# Patient Record
Sex: Male | Born: 1940 | Race: Black or African American | Hispanic: No | Marital: Single | Smoking: Never smoker
Health system: Southern US, Community
[De-identification: ages and names within clinical notes are randomized; demographics above are authoritative.]

## PROBLEM LIST (undated history)

## (undated) DIAGNOSIS — I10 Essential (primary) hypertension: Secondary | ICD-10-CM

## (undated) HISTORY — PX: NO PAST SURGERIES: SHX2092

---

## 2011-03-29 ENCOUNTER — Emergency Department (HOSPITAL_BASED_OUTPATIENT_CLINIC_OR_DEPARTMENT_OTHER)
Admission: EM | Admit: 2011-03-29 | Discharge: 2011-03-29 | Disposition: A | Discharge status: Home / Self Care | Payer: PRIVATE HEALTH INSURANCE | Attending: Emergency Medicine | Admitting: Emergency Medicine

## 2011-03-29 DIAGNOSIS — M722 Plantar fascial fibromatosis: Secondary | ICD-10-CM | POA: Insufficient documentation

## 2011-03-29 DIAGNOSIS — M79609 Pain in unspecified limb: Secondary | ICD-10-CM | POA: Insufficient documentation

## 2017-05-13 ENCOUNTER — Other Ambulatory Visit (HOSPITAL_BASED_OUTPATIENT_CLINIC_OR_DEPARTMENT_OTHER): Payer: Self-pay | Admitting: Unknown Physician Specialty

## 2017-05-13 DIAGNOSIS — M5489 Other dorsalgia: Secondary | ICD-10-CM

## 2017-05-17 ENCOUNTER — Encounter (HOSPITAL_BASED_OUTPATIENT_CLINIC_OR_DEPARTMENT_OTHER): Payer: Self-pay

## 2017-05-17 ENCOUNTER — Ambulatory Visit (HOSPITAL_BASED_OUTPATIENT_CLINIC_OR_DEPARTMENT_OTHER): Payer: PRIVATE HEALTH INSURANCE

## 2017-05-20 ENCOUNTER — Ambulatory Visit: Payer: PRIVATE HEALTH INSURANCE | Admitting: Physical Therapy

## 2018-05-06 ENCOUNTER — Other Ambulatory Visit: Payer: Self-pay

## 2018-05-06 ENCOUNTER — Encounter: Payer: Self-pay | Admitting: Physical Therapy

## 2018-05-06 ENCOUNTER — Ambulatory Visit: Payer: Medicare (Managed Care) | Attending: Internal Medicine | Admitting: Physical Therapy

## 2018-05-06 DIAGNOSIS — M6281 Muscle weakness (generalized): Secondary | ICD-10-CM | POA: Diagnosis present

## 2018-05-06 DIAGNOSIS — G8929 Other chronic pain: Secondary | ICD-10-CM | POA: Diagnosis present

## 2018-05-06 DIAGNOSIS — R293 Abnormal posture: Secondary | ICD-10-CM | POA: Diagnosis present

## 2018-05-06 DIAGNOSIS — R29898 Other symptoms and signs involving the musculoskeletal system: Secondary | ICD-10-CM | POA: Diagnosis present

## 2018-05-06 DIAGNOSIS — M5441 Lumbago with sciatica, right side: Secondary | ICD-10-CM | POA: Insufficient documentation
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
## 2018-05-11 ENCOUNTER — Ambulatory Visit: Payer: Medicare (Managed Care)

## 2018-05-11 DIAGNOSIS — R293 Abnormal posture: Secondary | ICD-10-CM

## 2018-05-11 DIAGNOSIS — M6281 Muscle weakness (generalized): Secondary | ICD-10-CM

## 2018-05-11 DIAGNOSIS — M5441 Lumbago with sciatica, right side: Principal | ICD-10-CM

## 2018-05-11 DIAGNOSIS — G8929 Other chronic pain: Secondary | ICD-10-CM

## 2018-05-11 DIAGNOSIS — R29898 Other symptoms and signs involving the musculoskeletal system: Secondary | ICD-10-CM

## 2018-05-11 NOTE — Therapy (Addendum)
[facility] [place] [address]  [address] [place], [place], [address] Phone: [phone]   Fax:  [phone]  Physical Therapy Treatment  Patient Details  Name: Ross Friedman MRN: [id number] Date of Birth: 03/24/1941 Referring Provider: Otila BackMolly Collins, MD   Encounter Date: 05/11/2018  PT End of Session - 05/11/18 0937    Visit Number  2    Number of Visits  13    Date for PT Re-Evaluation  06/17/18    Authorization Type  Commercial    PT Start Time  0930    PT Stop Time  1023    PT Time Calculation (min)  53 min    Activity Tolerance  Patient tolerated treatment well    Behavior During Therapy  [facility] for tasks assessed/performed       No past medical history on file.  No past surgical history on file.  There were no vitals filed for this visit.  Subjective Assessment - 05/11/18 0941    Subjective  Notes no issues with HEP.      Diagnostic tests  06/27/17 lumbar MRI: acute or subacute L1 compression fracture     Patient Stated Goals  get better    Currently in Pain?  Yes    Pain Score  6     Pain Location  Back    Pain Orientation  Right;Lower    Pain Descriptors / Indicators  Sharp    Pain Type  Chronic pain    Pain Radiating Towards  R LE     Pain Onset  More than a month ago    Pain Frequency  Constant    Aggravating Factors   unsure     Pain Relieving Factors  sitting resting     Multiple Pain Sites  No            Modalities:   E-stim: 80-150Hz , intensity to pt. Tolerance, 15', lumbar spine   Moist heat to lumbar spine: 2215'            [facility] Adult PT Treatment/Exercise - 05/11/18 0951      Lumbar Exercises: Stretches   Single Knee to Chest Stretch  Right;Left;1 rep;30 seconds    Single Knee to Chest Stretch Limitations  Cues for proper motion     Lower Trunk Rotation  20 seconds;3 reps    Lower Trunk Rotation Limitations  cues to avoid painful arc    Piriformis Stretch  Right;Left;30 seconds;2  reps    Piriformis Stretch Limitations  Figure 4, KTOS      Lumbar Exercises: Aerobic   Nustep  Lvl 5, 7 min    LE only     Lumbar Exercises: Standing   Other Standing Lumbar Exercises  B forward 6" step-up x 10 reps each; focusing on slow eccentric movements     Other Standing Lumbar Exercises  B lateral 6" step ups x 10 with cueing to brace abdominal musculature for neutral lumbar spine       Lumbar Exercises: Supine   Bent Knee Raise  10 reps;3 seconds    Bent Knee Raise Limitations  red looped TB at knees       Lumbar Exercises: Sidelying   Hip Abduction  Right;Left;10 reps;3 seconds               PT Short Term Goals - 05/11/18 0944      PT SHORT TERM GOAL #1   Title  Patient to be independent with initial  HEP.    Time  3    Period  Weeks    Status  On-going        PT Long Term Goals - 05/11/18 0940      PT LONG TERM GOAL #1   Title  Patient to be independent with advanced HEP.    Time  6    Period  Weeks    Status  On-going      PT LONG TERM GOAL #2   Title  Patient to demonstrate [facility] and pain-free lumbar AROM.    Time  6    Period  Weeks    Status  On-going      PT LONG TERM GOAL #3   Title  Patient to demonstrate >=4+/5 strength in B LEs.    Time  6    Period  Weeks    Status  On-going      PT LONG TERM GOAL #4   Title  Patient to report tolerance of 1 hour of standing without pain limiting.    Time  6    Period  Weeks    Status  On-going      PT LONG TERM GOAL #5   Title  Patient to report tolerance of climbing 3 flights of stairs to apartment without taking a break d/t pain.    Time  6    Period  Weeks    Status  On-going            Plan - 05/11/18 0944    Clinical Impression Statement  Pt. doing well today and notes he did not have issues with HEP.  Did require minor correction of technique with HEP review today to avoid painful end range motion and overstretching.  Tolerated addition of forward and lateral step-up well today.   Did end therex with lower back pain thus initiated trial of E-stim/moist heat to lumbar spine to decrease pain and tone.  Ended session pain free.  Will continue to progress toward goals.      PT Treatment/Interventions  ADLs/Self Care Home Management;Cryotherapy;Electrical Stimulation;Moist Heat;Ultrasound;Gait training;Stair training;Functional mobility training;Therapeutic activities;Therapeutic exercise;Manual techniques;Patient/family education;Neuromuscular re-education;Balance training;Passive range of motion;Dry needling;Energy conservation;Splinting;Taping    Consulted and Agree with Plan of Care  Patient       Patient will benefit from skilled therapeutic intervention in order to improve the following deficits and impairments:  Decreased activity tolerance, Decreased strength, Pain, Difficulty walking, Decreased range of motion, Postural dysfunction, Impaired flexibility  Visit Diagnosis: Chronic midline low back pain with right-sided sciatica  Other symptoms and signs involving the musculoskeletal system  Muscle weakness (generalized)  Abnormal posture     Problem List There are no active problems to display for this patient.   Kermit BaloMicah Gena Laski, PTA 05/11/18 3:06 PM    [facility] [place] [address]  [address] [place], [place], [address] Phone: [phone]   Fax:  [phone]  Name: Ross Friedman MRN: [id number] Date of Birth: 03/14/1941

## 2018-05-13 ENCOUNTER — Ambulatory Visit: Payer: Medicare (Managed Care)

## 2018-05-13 DIAGNOSIS — G8929 Other chronic pain: Secondary | ICD-10-CM

## 2018-05-13 DIAGNOSIS — M5441 Lumbago with sciatica, right side: Principal | ICD-10-CM

## 2018-05-13 DIAGNOSIS — R293 Abnormal posture: Secondary | ICD-10-CM

## 2018-05-13 DIAGNOSIS — R29898 Other symptoms and signs involving the musculoskeletal system: Secondary | ICD-10-CM

## 2018-05-13 DIAGNOSIS — M6281 Muscle weakness (generalized): Secondary | ICD-10-CM

## 2018-05-13 NOTE — Therapy (Signed)
[facility] [address]  [address] [place], [place], [address] Phone: [phone]   Fax:  [phone]  Physical Therapy Treatment  Patient Details  Name: Ross DadLesca Friedman MRN: [id number] Date of Birth: 02/23/1941 Referring Provider: Otila BackMolly Collins, MD   Encounter Date: 05/13/2018  PT End of Session - 05/13/18 1443    Visit Number  3    Number of Visits  13    Date for PT Re-Evaluation  06/17/18    Authorization Type  Commercial    PT Start Time  1440    PT Stop Time  1540    PT Time Calculation (min)  60 min    Activity Tolerance  Patient tolerated treatment well    Behavior During Therapy  [facility] for tasks assessed/performed       No past medical history on file.  No past surgical history on file.  There were no vitals filed for this visit.  Subjective Assessment - 05/13/18 1441    Subjective  Pt. doing well today noting no soreness following last visit.      Diagnostic tests  06/27/17 lumbar MRI: acute or subacute L1 compression fracture     Patient Stated Goals  get better    Currently in Pain?  Yes    Pain Score  7     Pain Location  Back    Pain Orientation  Right;Lower    Pain Descriptors / Indicators  Sharp    Pain Type  Chronic pain    Pain Radiating Towards  radiating "tingling" pain down R LE    Pain Onset  More than a month ago    Pain Frequency  Constant    Aggravating Factors   walking     Multiple Pain Sites  No                       [facility] Adult PT Treatment/Exercise - 05/13/18 1454      Lumbar Exercises: Aerobic   Nustep  Lvl 5, 5 min    UE/LE     Lumbar Exercises: Machines for Strengthening   Cybex Knee Flexion  B LE's: 20# x 15 rpes       Lumbar Exercises: Standing   Row  --    Theraband Level (Row)  --    Other Standing Lumbar Exercises  --      Lumbar Exercises: Seated   Other Seated Lumbar Exercises  B seated HS curl with red TB x 10 reps each       Lumbar Exercises:  Supine   Ab Set  15 reps;5 seconds    AB Set Limitations  + TrA    Clam  10 reps;3 seconds    Clam Limitations  Hooklying with red TB at knees    Bridge  10 reps;3 seconds   limited ROM    Bridge Limitations  with isometric hip abd/ER into red TB at knees     Straight Leg Raise  10 reps;3 seconds    Straight Leg Raises Limitations  L only     Isometric Hip Flexion  15 reps;5 seconds    Isometric Hip Flexion Limitations  with heels on peanut p-ball       Knee/Hip Exercises: Standing   Forward Step Up  Right;Left;Step Height: 8";10 reps    Forward Step Up Limitations  + abdom. bracing with 1 ski pole support      Modalities   Modalities  Electrical Stimulation;Moist Heat      Moist Heat Therapy   Number Minutes Moist Heat  15 Minutes    Moist Heat Location  Lumbar Spine      Electrical Stimulation   Electrical Stimulation Location  lumbar spine     Electrical Stimulation Action  IFC    Electrical Stimulation Parameters  to tolerance, 15'    Electrical Stimulation Goals  Pain               PT Short Term Goals - 05/11/18 0944      PT SHORT TERM GOAL #1   Title  Patient to be independent with initial HEP.    Time  3    Period  Weeks    Status  On-going        PT Long Term Goals - 05/11/18 0940      PT LONG TERM GOAL #1   Title  Patient to be independent with advanced HEP.    Time  6    Period  Weeks    Status  On-going      PT LONG TERM GOAL #2   Title  Patient to demonstrate [facility] and pain-free lumbar AROM.    Time  6    Period  Weeks    Status  On-going      PT LONG TERM GOAL #3   Title  Patient to demonstrate >=4+/5 strength in B LEs.    Time  6    Period  Weeks    Status  On-going      PT LONG TERM GOAL #4   Title  Patient to report tolerance of 1 hour of standing without pain limiting.    Time  6    Period  Weeks    Status  On-going      PT LONG TERM GOAL #5   Title  Patient to report tolerance of climbing 3 flights of stairs to apartment  without taking a break d/t pain.    Time  6    Period  Weeks    Status  On-going            Plan - 05/13/18 1444    Clinical Impression Statement  Pt. doing well today however notes continued LBP.  Pt. continues to demo flexion bias with therex with greatest comfort with hooklying activities.  Tolerated addition of sit<>stand, partial ROM bridging, and HS curl well today.  Ended session with pt. requesting E-stim/moist heat to lumbar spine as pt. noting good pain relief from this last session.      PT Treatment/Interventions  ADLs/Self Care Home Management;Cryotherapy;Electrical Stimulation;Moist Heat;Ultrasound;Gait training;Stair training;Functional mobility training;Therapeutic activities;Therapeutic exercise;Manual techniques;Patient/family education;Neuromuscular re-education;Balance training;Passive range of motion;Dry needling;Energy conservation;Splinting;Taping    Consulted and Agree with Plan of Care  Patient       Patient will benefit from skilled therapeutic intervention in order to improve the following deficits and impairments:  Decreased activity tolerance, Decreased strength, Pain, Difficulty walking, Decreased range of motion, Postural dysfunction, Impaired flexibility  Visit Diagnosis: Chronic midline low back pain with right-sided sciatica  Other symptoms and signs involving the musculoskeletal system  Muscle weakness (generalized)  Abnormal posture     Problem List There are no active problems to display for this patient.   Kermit BaloMicah Ashleah Valtierra, PTA 05/13/18 6:22 PM   [facility] [place] [address]  [address] [place], [place], [address] Phone: [phone]   Fax:  [phone]  Name: Ross DadLesca Friedman  MRN: [id number] Date of Birth: [birth year]

## 2018-05-19 ENCOUNTER — Ambulatory Visit: Payer: Medicare (Managed Care) | Admitting: Physical Therapy

## 2018-05-19 DIAGNOSIS — G8929 Other chronic pain: Secondary | ICD-10-CM

## 2018-05-19 DIAGNOSIS — M6281 Muscle weakness (generalized): Secondary | ICD-10-CM

## 2018-05-19 DIAGNOSIS — R29898 Other symptoms and signs involving the musculoskeletal system: Secondary | ICD-10-CM

## 2018-05-19 DIAGNOSIS — R293 Abnormal posture: Secondary | ICD-10-CM

## 2018-05-19 DIAGNOSIS — M5441 Lumbago with sciatica, right side: Secondary | ICD-10-CM | POA: Diagnosis not present

## 2018-05-19 NOTE — Therapy (Signed)
[facility] [address]  [address] [place], [place], [address] Phone: [phone]   Fax:  [phone]  Physical Therapy Treatment  Patient Details  Name: Ross Friedman MRN: [id number] Date of Birth: 08/24/1941 Referring Provider: Otila BackMolly Collins, MD   Encounter Date: 05/19/2018  PT End of Session - 05/19/18 1505    Visit Number  4    Number of Visits  13    Date for PT Re-Evaluation  06/17/18    Authorization Type  Commercial    PT Start Time  1315    PT Stop Time  1404    PT Time Calculation (min)  49 min    Activity Tolerance  Patient tolerated treatment well;Patient limited by pain    Behavior During Therapy  [facility] for tasks assessed/performed       No past medical history on file.  No past surgical history on file.  There were no vitals filed for this visit.  Subjective Assessment - 05/19/18 1315    Subjective  Reports he still has a little pain. After last session and over the weekend he was having a burning sensation in the R hip. Does not believe it was from last session as his symptoms are inconsistent.     Diagnostic tests  06/27/17 lumbar MRI: acute or subacute L1 compression fracture     Patient Stated Goals  get better    Currently in Pain?  Yes    Pain Score  6     Pain Location  Back    Pain Orientation  Right;Lower    Pain Descriptors / Indicators  Sharp    Pain Type  Chronic pain                       [facility] Adult PT Treatment/Exercise - 05/19/18 0001      Exercises   Exercises  Lumbar      Lumbar Exercises: Aerobic   Nustep  L3 x 6min      Lumbar Exercises: Standing   Other Standing Lumbar Exercises  pallof press with red TB x 10 each side   cues for upright stance and avoiding rotation   Other Standing Lumbar Exercises  B anterior 8" step ups x 10 with cueing to brace abdominal musculature for neutral lumbar spine       Lumbar Exercises: Supine   Ab Set  10 reps;Limitations     AB Set Limitations  TrA contraction 10x5"    Clam  15 reps;Limitations    Clam Limitations  green TB around knees; cues to increase range    Bent Knee Raise  10 reps;Limitations    Bent Knee Raise Limitations  TrA + alt march- patient unable to tolerate and discontinued    Other Supine Lumbar Exercises  bent knee fallouts + TrA x20    Other Supine Lumbar Exercises  resisted hip flexion with red TB around ankles and LEs on pball; 10x      Electrical Stimulation   Electrical Stimulation Location  R lower lumbar region    Electrical Stimulation Action  IFC    Electrical Stimulation Parameters  Output 15 to tolerance; 15"    Electrical Stimulation Goals  Pain             PT Education - 05/19/18 1505    Education Details  update to HEP    Person(s) Educated  Patient    Methods  Explanation;Demonstration;Tactile cues;Verbal cues;Handout  Comprehension  Returned demonstration;Verbalized understanding       PT Short Term Goals - 05/19/18 1508      PT SHORT TERM GOAL #1   Title  Patient to be independent with initial HEP.    Time  3    Period  Weeks    Status  Achieved        PT Long Term Goals - 05/11/18 0940      PT LONG TERM GOAL #1   Title  Patient to be independent with advanced HEP.    Time  6    Period  Weeks    Status  On-going      PT LONG TERM GOAL #2   Title  Patient to demonstrate [facility] and pain-free lumbar AROM.    Time  6    Period  Weeks    Status  On-going      PT LONG TERM GOAL #3   Title  Patient to demonstrate >=4+/5 strength in B LEs.    Time  6    Period  Weeks    Status  On-going      PT LONG TERM GOAL #4   Title  Patient to report tolerance of 1 hour of standing without pain limiting.    Time  6    Period  Weeks    Status  On-going      PT LONG TERM GOAL #5   Title  Patient to report tolerance of climbing 3 flights of stairs to apartment without taking a break d/t pain.    Time  6    Period  Weeks    Status  On-going             Plan - 05/19/18 1506    Clinical Impression Statement  Patient arrived to session with report of burning R hip pain over the weekend, improved today. Focused beginning of session on core stability exercises with manual instruction on achieving TrA contraction. Patient could not tolerate TrA contraction with marching, better tolerance of bent knee fallouts. Able to tolerate progression of supine hip ER with green banded resistance this date. Cues required to avoid hesitation and encourage increased ROM with hip strengthening ther-ex this date. Patient with good form during anterior step ups and denial of increased LBP with this activity. Patient requesting e-stim at end of session as this provides him with pain relief. Normal integumentary response noted at end of session. Updated HEP and provided patient with handout. Patient reported understanding.     PT Treatment/Interventions  ADLs/Self Care Home Management;Cryotherapy;Electrical Stimulation;Moist Heat;Ultrasound;Gait training;Stair training;Functional mobility training;Therapeutic activities;Therapeutic exercise;Manual techniques;Patient/family education;Neuromuscular re-education;Balance training;Passive range of motion;Dry needling;Energy conservation;Splinting;Taping    Consulted and Agree with Plan of Care  Patient       Patient will benefit from skilled therapeutic intervention in order to improve the following deficits and impairments:  Decreased activity tolerance, Decreased strength, Pain, Difficulty walking, Decreased range of motion, Postural dysfunction, Impaired flexibility  Visit Diagnosis: Chronic midline low back pain with right-sided sciatica  Other symptoms and signs involving the musculoskeletal system  Muscle weakness (generalized)  Abnormal posture     Problem List There are no active problems to display for this patient.   Anette GuarneriYevgeniya Shamiyah Ngu, PT, DPT 05/19/18 3:09 PM    [facility] [place] [address]  [address] [place], [place], [address] Phone: [phone]   Fax:  [phone]  Name: Ross Friedman MRN: [id number] Date of Birth: 08/19/1941

## 2018-05-21 ENCOUNTER — Ambulatory Visit: Payer: Medicare (Managed Care) | Admitting: Physical Therapy

## 2018-05-21 DIAGNOSIS — G8929 Other chronic pain: Secondary | ICD-10-CM

## 2018-05-21 DIAGNOSIS — M5441 Lumbago with sciatica, right side: Secondary | ICD-10-CM | POA: Diagnosis not present

## 2018-05-21 DIAGNOSIS — R293 Abnormal posture: Secondary | ICD-10-CM

## 2018-05-21 DIAGNOSIS — M6281 Muscle weakness (generalized): Secondary | ICD-10-CM

## 2018-05-21 DIAGNOSIS — R29898 Other symptoms and signs involving the musculoskeletal system: Secondary | ICD-10-CM

## 2018-05-21 NOTE — Therapy (Signed)
[facility] [address]  [address] [place], [place], [address] Phone: [phone]   Fax:  [phone]  Physical Therapy Treatment  Patient Details  Name: Ross Friedman MRN: [id number] Date of Birth: 02/08/1941 Referring Provider: Otila BackMolly Collins, MD   Encounter Date: 05/21/2018  PT End of Session - 05/21/18 1007    Visit Number  5    Number of Visits  13    Date for PT Re-Evaluation  06/17/18    Authorization Type  Commercial    PT Start Time  0930    PT Stop Time  1019    PT Time Calculation (min)  49 min    Activity Tolerance  Patient tolerated treatment well;Patient limited by pain    Behavior During Therapy  [facility] for tasks assessed/performed       No past medical history on file.  No past surgical history on file.  There were no vitals filed for this visit.  Subjective Assessment - 05/21/18 0931    Subjective  Reports a little better compared to yesterday but still in pain.     Diagnostic tests  06/27/17 lumbar MRI: acute or subacute L1 compression fracture     Patient Stated Goals  get better    Currently in Pain?  Yes    Pain Score  6     Pain Location  Back    Pain Orientation  Right;Lower    Pain Descriptors / Indicators  Sharp    Pain Type  Chronic pain                       [facility] Adult PT Treatment/Exercise - 05/21/18 0001      Exercises   Exercises  Knee/Hip;Lumbar      Lumbar Exercises: Aerobic   Nustep  L3 x 6min      Lumbar Exercises: Standing   Row  Strengthening;Both;15 reps;Theraband;Limitations    Theraband Level (Row)  Level 3 (Green)    Row Limitations  cues for core contraction and scap retraction    Other Standing Lumbar Exercises  pallof press with red TB x 10 each side   cues for form     Lumbar Exercises: Seated   Long Arc Quad on [place]  Strengthening;Both;1 set;20 reps   c/o back pain after completing activity   LAQ on Ball Limitations  CGA/min A; moderate  instability, worse on R     Hip Flexion on Ball  Strengthening;Right;Left;20 reps;Limitations    Hip Flexion on Ball Limitations  heavy VC/TCs for ant pelvic tilt and upright posture      Lumbar Exercises: Supine   Clam  15 reps;Limitations    Clam Limitations  green TB around knees; cues for core contraction'      Knee/Hip Exercises: Standing   Other Standing Knee Exercises  B heel toe raise at counter top x 15      Knee/Hip Exercises: Sidelying   Clams  10x each side   TCs for form     Electrical Stimulation   Electrical Stimulation Location  R lower lumbar region    Electrical Stimulation Action  IFC    Electrical Stimulation Parameters  Output: 21 to tolerance; 15 min    Electrical Stimulation Goals  Pain             PT Education - 05/21/18 1007    Education Details  edu on personal TENS use    Person(s) Educated  Patient  Methods  Explanation;Demonstration;Tactile cues;Verbal cues;Handout    Comprehension  Returned demonstration;Verbalized understanding       PT Short Term Goals - 05/19/18 1508      PT SHORT TERM GOAL #1   Title  Patient to be independent with initial HEP.    Time  3    Period  Weeks    Status  Achieved        PT Long Term Goals - 05/11/18 0940      PT LONG TERM GOAL #1   Title  Patient to be independent with advanced HEP.    Time  6    Period  Weeks    Status  On-going      PT LONG TERM GOAL #2   Title  Patient to demonstrate [facility] and pain-free lumbar AROM.    Time  6    Period  Weeks    Status  On-going      PT LONG TERM GOAL #3   Title  Patient to demonstrate >=4+/5 strength in B LEs.    Time  6    Period  Weeks    Status  On-going      PT LONG TERM GOAL #4   Title  Patient to report tolerance of 1 hour of standing without pain limiting.    Time  6    Period  Weeks    Status  On-going      PT LONG TERM GOAL #5   Title  Patient to report tolerance of climbing 3 flights of stairs to apartment without taking a break d/t  pain.    Time  6    Period  Weeks    Status  On-going            Plan - 05/21/18 1008    Clinical Impression Statement  Patient arrived to session with no new complaints. Reports e-stim is giving him benefit- spoke to patient about personal TENS unit use and provided education. Patient reported understanding. Introduced Engineer, agricultural for progressive hip strengthening- good tolerance by patient. Worked on postural correction exercises with cues required for core contraction and scapular retraction. Introduced core stability exercises sitting on pball- patient with moderate instability and report of LBP after completing LAQ, better tolerance of sitting marching. Requested e-stim to LB at end of session for pain relief. Normal integumentary response noted at end of session.     PT Treatment/Interventions  ADLs/Self Care Home Management;Cryotherapy;Electrical Stimulation;Moist Heat;Ultrasound;Gait training;Stair training;Functional mobility training;Therapeutic activities;Therapeutic exercise;Manual techniques;Patient/family education;Neuromuscular re-education;Balance training;Passive range of motion;Dry needling;Energy conservation;Splinting;Taping    Consulted and Agree with Plan of Care  Patient       Patient will benefit from skilled therapeutic intervention in order to improve the following deficits and impairments:  Decreased activity tolerance, Decreased strength, Pain, Difficulty walking, Decreased range of motion, Postural dysfunction, Impaired flexibility  Visit Diagnosis: Chronic midline low back pain with right-sided sciatica  Other symptoms and signs involving the musculoskeletal system  Muscle weakness (generalized)  Abnormal posture     Problem List There are no active problems to display for this patient.   Anette Guarneri, PT, DPT 05/21/18 11:31 AM   [facility] [address]  [address] [place], [place],  [address] Phone: [phone]   Fax:  [phone]  Name: Seger Jani MRN: [id number] Date of Birth: [birth year]

## 2018-05-21 NOTE — Patient Instructions (Signed)

## 2018-05-26 ENCOUNTER — Encounter: Payer: Self-pay | Admitting: Physical Therapy

## 2018-05-26 ENCOUNTER — Ambulatory Visit: Payer: Medicare (Managed Care) | Admitting: Physical Therapy

## 2018-05-26 DIAGNOSIS — M5441 Lumbago with sciatica, right side: Secondary | ICD-10-CM | POA: Diagnosis not present

## 2018-05-26 DIAGNOSIS — R29898 Other symptoms and signs involving the musculoskeletal system: Secondary | ICD-10-CM

## 2018-05-26 DIAGNOSIS — G8929 Other chronic pain: Secondary | ICD-10-CM

## 2018-05-26 DIAGNOSIS — R293 Abnormal posture: Secondary | ICD-10-CM

## 2018-05-26 DIAGNOSIS — M6281 Muscle weakness (generalized): Secondary | ICD-10-CM

## 2018-05-26 NOTE — Therapy (Signed)
[facility] [address]  [address] [place], [place], [address] Phone: [phone]   Fax:  [phone]  Physical Therapy Treatment  Patient Details  Name: Ross DadLesca Friedman MRN: [id number] Date of Birth: 06/03/1941 Referring Provider: Otila BackMolly Collins, MD   Encounter Date: 05/26/2018  PT End of Session - 05/26/18 1410    Visit Number  6    Number of Visits  13    Date for PT Re-Evaluation  06/17/18    Authorization Type  Commercial    PT Start Time  1315    PT Stop Time  1405    PT Time Calculation (min)  50 min    Activity Tolerance  Patient tolerated treatment well;Patient limited by pain    Behavior During Therapy  [facility] for tasks assessed/performed       History reviewed. No pertinent past medical history.  History reviewed. No pertinent surgical history.  There were no vitals filed for this visit.  Subjective Assessment - 05/26/18 1316    Subjective  Patient reports he is still having a considerabnle about of pain. Over the weekend, have intense pain and only mildly alleviated with 1000mg  of tylenol. Patient reports he feels like he is getting strong but has not noticed much improvement in pain levels. TENS unit is the only thing that seems to help.     Diagnostic tests  06/27/17 lumbar MRI: acute or subacute L1 compression fracture     Patient Stated Goals  get better    Currently in Pain?  Yes    Pain Score  5     Pain Location  Back    Pain Orientation  Right;Lower    Pain Descriptors / Indicators  Sharp    Pain Type  Chronic pain                       [facility] Adult PT Treatment/Exercise - 05/26/18 0001      Exercises   Exercises  Lumbar;Knee/Hip      Lumbar Exercises: Seated   Long Arc Quad on [place]  Strengthening;1 set;Right;Left;10 reps;Limitations    LAQ on [place] Limitations  sitting on green pball palloff press with red TB 10x each UE   heavy VCs for ant pelvic tilt and scap retraction   Hip  Flexion on Ball  Strengthening;Right;Left;Limitations;10 reps    Hip Flexion on Ball Limitations  discontinued d/t R back pain    Other Seated Lumbar Exercises  scapular row sitting on greeen pball x 10 red TB   cues for scap squeeze and upright posture   Other Seated Lumbar Exercises  sitting on pball pelvic tilts 10x   pain with anterior pelvic tilt     Knee/Hip Exercises: Machines for Strengthening   Cybex Knee Extension  B LE 15# x 10    Cybex Knee Flexion  B LE 20# x 10             PT Education - 05/26/18 1401    Education Details  considerable amount of time spent on instruction of assembly, precautions, electrode placement, and practice of personal TENS unit use.    Person(s) Educated  Patient    Methods  Explanation;Demonstration;Tactile cues;Verbal cues;Handout    Comprehension  Returned demonstration;Verbalized understanding       PT Short Term Goals - 05/19/18 1508      PT SHORT TERM GOAL #1   Title  Patient to be independent with initial HEP.  Time  3    Period  Weeks    Status  Achieved        PT Long Term Goals - 05/11/18 0940      PT LONG TERM GOAL #1   Title  Patient to be independent with advanced HEP.    Time  6    Period  Weeks    Status  On-going      PT LONG TERM GOAL #2   Title  Patient to demonstrate [facility] and pain-free lumbar AROM.    Time  6    Period  Weeks    Status  On-going      PT LONG TERM GOAL #3   Title  Patient to demonstrate >=4+/5 strength in B LEs.    Time  6    Period  Weeks    Status  On-going      PT LONG TERM GOAL #4   Title  Patient to report tolerance of 1 hour of standing without pain limiting.    Time  6    Period  Weeks    Status  On-going      PT LONG TERM GOAL #5   Title  Patient to report tolerance of climbing 3 flights of stairs to apartment without taking a break d/t pain.    Time  6    Period  Weeks    Status  On-going            Plan - 05/26/18 1410    Clinical Impression Statement   Patient arrived to session with report of increased R sided LBP starting on Friday and through the weekend, will improvement in pain levels today without known cause. Considerable amount of time spent on instruction of precautions, electrode placement, and practice of personal TENS unit use. All questions answered by the end of session. Worked on core and LE strengthening this session however patient with limited tolerance of LE marching while sitting on physioball. Activity was discontinued. Patient reporting that he feels stronger since starting PT however has not noticed much improvement in pain levels. Patient also with no consistent aggravating/easing factors and for this reason gave patient recommendation of following up with his MD about persisting back pain. Patient reported understanding.     PT Treatment/Interventions  ADLs/Self Care Home Management;Cryotherapy;Electrical Stimulation;Moist Heat;Ultrasound;Gait training;Stair training;Functional mobility training;Therapeutic activities;Therapeutic exercise;Manual techniques;Patient/family education;Neuromuscular re-education;Balance training;Passive range of motion;Dry needling;Energy conservation;Splinting;Taping    Consulted and Agree with Plan of Care  Patient       Patient will benefit from skilled therapeutic intervention in order to improve the following deficits and impairments:  Decreased activity tolerance, Decreased strength, Pain, Difficulty walking, Decreased range of motion, Postural dysfunction, Impaired flexibility  Visit Diagnosis: Chronic midline low back pain with right-sided sciatica  Other symptoms and signs involving the musculoskeletal system  Muscle weakness (generalized)  Abnormal posture     Problem List There are no active problems to display for this patient.   Anette Guarneri, PT, DPT 05/26/18 2:13 PM   [facility] [address]  [address] [place], [place], [address] Phone: [phone]   Fax:  [phone]  Name: Ross Friedman MRN: [id number] Date of Birth: 1941/01/27

## 2018-05-26 NOTE — Patient Instructions (Signed)
TENS stands for Transcutaneous Electrical Nerve Stimulation. In other words, electrical impulses are allowed to pass through the skin in order to excite a nerve.   Purpose and Use of TENS:  TENS is a method used to manage acute and chronic pain without the use of drugs. It has been effective in managing pain associated with surgery, sprains, strains, trauma, rheumatoid arthritis, and neuralgias. It is a non-addictive, low risk, and non-invasive technique used to control pain. It is not, by any means, a curative form of treatment.   How TENS Works:  Most TENS units are a small pocket-sized unit powered by one 9 volt battery. Attached to the outside of the unit are two lead wires where two pins and/or snaps connect on each wire. All units come with a set of four reusable pads or electrodes. These are placed on the skin surrounding the area involved. By inserting the leads into  the pads, the electricity can pass from the unit making the circuit complete.  As the intensity is turned up slowly, the electrical current enters the body from the electrodes through the skin to the surrounding nerve fibers. This triggers the release of hormones from within the body. These hormones contain pain relievers. By increasing the circulation of these hormones, the person's pain may be lessened. It is also believed that the electrical stimulation itself helps to block the pain messages being sent to the brain, thus also decreasing the body's perception of pain.   Hazards:  TENS units are NOT to be used by patients with PACEMAKERS, DEFIBRILLATORS, DIABETIC PUMPS, PREGNANT WOMEN, and patients with SEIZURE DISORDERS.  TENS units are NOT to be used over the heart, throat, brain, or spinal cord.  One of the major side effects from the TENS unit may be skin irritation. Some people may develop a rash if they are sensitive to the materials used in the electrodes or the connecting wires.   Wear the unit for 15 minutes.   Avoid  overuse due the body getting used to the stem making it not as effective over time.    

## 2018-05-28 ENCOUNTER — Ambulatory Visit: Payer: Medicare (Managed Care)

## 2018-05-28 DIAGNOSIS — G8929 Other chronic pain: Secondary | ICD-10-CM

## 2018-05-28 DIAGNOSIS — M5441 Lumbago with sciatica, right side: Principal | ICD-10-CM

## 2018-05-28 DIAGNOSIS — R293 Abnormal posture: Secondary | ICD-10-CM

## 2018-05-28 DIAGNOSIS — M6281 Muscle weakness (generalized): Secondary | ICD-10-CM

## 2018-05-28 DIAGNOSIS — R29898 Other symptoms and signs involving the musculoskeletal system: Secondary | ICD-10-CM

## 2018-05-28 NOTE — Therapy (Signed)
[facility] Outpatient Rehabilitation [facility] [address]  [address] [place], [place], [address] Phone: [phone]   Fax:  [phone]  Physical Therapy Treatment  Patient Details  Name: Ross Friedman MRN: [id number] Date of Birth: 1941-02-15 Referring Provider: Otila Back, MD   Encounter Date: 05/28/2018  PT End of Session - 05/28/18 1407    Visit Number  7    Number of Visits  13    Date for PT Re-Evaluation  06/17/18    Authorization Type  Commercial    PT Start Time  1403    PT Stop Time  1500    PT Time Calculation (min)  57 min    Activity Tolerance  Patient tolerated treatment well;Patient limited by pain    Behavior During Therapy  [facility] for tasks assessed/performed       No past medical history on file.  No past surgical history on file.  There were no vitals filed for this visit.  Subjective Assessment - 05/28/18 1405    Subjective  Pt. noting some mild improvement in pain since starting therapy however notes climbing stairs is, "much easier" now.      Diagnostic tests  06/27/17 lumbar MRI: acute or subacute L1 compression fracture     Patient Stated Goals  get better    Currently in Pain?  Yes    Pain Score  7     Pain Location  Back    Pain Orientation  Right;Lower    Pain Descriptors / Indicators  Sharp    Pain Type  Chronic pain    Pain Radiating Towards  None today    Pain Onset  More than a month ago    Pain Frequency  Constant    Aggravating Factors   Sitting on low surface,       Pain Relieving Factors  sitting, resting    Multiple Pain Sites  No                       [facility] Adult PT Treatment/Exercise - 05/28/18 1433      Lumbar Exercises: Aerobic   Nustep  L4 x      Lumbar Exercises: Seated   Sit to Stand  10 reps    Sit to Stand Limitations  from mat table without UE support       Lumbar Exercises: Supine   Glut Set  10 reps;5 seconds    Glut Set Limitations  + abdom brace     Clam  15  reps;Limitations    Clam Limitations  red TB at knees with alternating hip abd/ER clam shell     Bent Knee Raise  3 seconds   x 12 reps    Bent Knee Raise Limitations  Alternating march with red looped TB at knees       Moist Heat Therapy   Number Minutes Moist Heat  15 Minutes    Moist Heat Location  Lumbar Spine      Electrical Stimulation   Electrical Stimulation Location  R lower lumbar region    Electrical Stimulation Action  IFC    Electrical Stimulation Parameters  to tolerance, 15'    Electrical Stimulation Goals  Pain      Manual Therapy   Manual Therapy  Soft tissue mobilization    Manual therapy comments  sidelying with bolster     Soft tissue mobilization  STM to L buttocks and L paraspinals with some tenderness  PT Short Term Goals - 05/19/18 1508      PT SHORT TERM GOAL #1   Title  Patient to be independent with initial HEP.    Time  3    Period  Weeks    Status  Achieved        PT Long Term Goals - 05/11/18 0940      PT LONG TERM GOAL #1   Title  Patient to be independent with advanced HEP.    Time  6    Period  Weeks    Status  On-going      PT LONG TERM GOAL #2   Title  [facility] and pain-free lumbar AROM.    Time  6    Period  Weeks    Status  On-going      PT LONG TERM GOAL #3   Title  Patient to demonstrate >=4+/5 strength in B LEs.    Time  6    Period  Weeks    Status  On-going      PT LONG TERM GOAL #4   Title  Patient to report tolerance of 1 hour of standing without pain limiting.    Time  6    Period  Weeks    Status  On-going      PT LONG TERM GOAL #5   Title  Patient to report tolerance of climbing 3 flights of stairs to apartment without taking a break d/t pain.    Time  6    Period  Weeks    Status  On-going            Plan - 05/28/18 1431    Clinical Impression Statement  Keaden noting improvement in LE strength and greater ease navigating stairs at home.  Tolerated  progression of lumbopelvic strengthening supine with flexion bias in hooklying well today.  Ended visit with E-stim/moist heat to lumbar spine to decrease post-exercise pain.  Progressing toward goals.      PT Treatment/Interventions  ADLs/Self Care Home Management;Cryotherapy;Electrical Stimulation;Moist Heat;Ultrasound;Gait training;Stair training;Functional mobility training;Therapeutic activities;Therapeutic exercise;Manual techniques;Patient/family education;Neuromuscular re-education;Balance training;Passive range of motion;Dry needling;Energy conservation;Splinting;Taping    Consulted and Agree with Plan of Care  Patient       Patient will benefit from skilled therapeutic intervention in order to improve the following deficits and impairments:  Decreased activity tolerance, Decreased strength, Pain, Difficulty walking, Decreased range of motion, Postural dysfunction, Impaired flexibility  Visit Diagnosis: Chronic midline low back pain with right-sided sciatica  Other symptoms and signs involving the musculoskeletal system  Muscle weakness (generalized)  Abnormal posture     Problem List There are no active problems to display for this patient.   Kermit BaloMicah Jannine Abreu, PTA 05/28/18 6:55 PM   [facility] [place] [address]  [address] [place], [place], [address] Phone: [phone]   Fax:  [phone]  Name: Ross Friedman MRN: [id number] Date of Birth: 02/18/1941

## 2018-06-02 ENCOUNTER — Ambulatory Visit: Payer: PRIVATE HEALTH INSURANCE | Attending: Internal Medicine

## 2018-06-02 DIAGNOSIS — R293 Abnormal posture: Secondary | ICD-10-CM

## 2018-06-02 DIAGNOSIS — M6281 Muscle weakness (generalized): Secondary | ICD-10-CM

## 2018-06-02 DIAGNOSIS — G8929 Other chronic pain: Secondary | ICD-10-CM | POA: Diagnosis present

## 2018-06-02 DIAGNOSIS — M5441 Lumbago with sciatica, right side: Secondary | ICD-10-CM | POA: Insufficient documentation

## 2018-06-02 DIAGNOSIS — R29898 Other symptoms and signs involving the musculoskeletal system: Secondary | ICD-10-CM

## 2018-06-02 NOTE — Therapy (Addendum)
[facility] [address]  [address] [place], [place], [address] Phone: [phone]   Fax:  [phone]  Physical Therapy Treatment  Patient Details  Name: Ross Friedman MRN: [id number] Date of Birth: [birth year] Referring Provider: Otila Back, MD   Encounter Date: 06/02/2018  PT End of Session - 06/02/18 1409    Visit Number  8    Number of Visits  13    Date for PT Re-Evaluation  06/17/18    Authorization Type  Commercial    PT Start Time  1402    PT Stop Time  1455    PT Time Calculation (min)  53 min    Activity Tolerance  Patient tolerated treatment well    Behavior During Therapy  [facility] for tasks assessed/performed       No past medical history on file.  No past surgical history on file.  There were no vitals filed for this visit.  Subjective Assessment - 06/02/18 1407    Subjective  Pt. doing well today.      Diagnostic tests  06/27/17 lumbar MRI: acute or subacute L1 compression fracture     Patient Stated Goals  get better    Currently in Pain?  Yes    Pain Score  8     Pain Location  Back    Pain Orientation  Right;Lower    Pain Descriptors / Indicators  Sharp    Pain Type  Chronic pain    Pain Onset  More than a month ago    Pain Frequency  Constant    Multiple Pain Sites  No                       [facility] Adult PT Treatment/Exercise - 06/02/18 1416      Lumbar Exercises: Aerobic   Nustep  L4 x      Lumbar Exercises: Machines for Strengthening   Cybex Knee Flexion  B LE's: 25# x 10 rpes     Other Lumbar Machine Exercise  BATCA low row 15# x 15 reps     Other Lumbar Machine Exercise  BATCA lat pulldown 20# x 10 rpes       Lumbar Exercises: Standing   Row  Strengthening;Both;15 reps;Theraband;Limitations    Theraband Level (Row)  Level 3 (Green)    Shoulder Extension  10 reps;Strengthening;Theraband;Both    Theraband Level (Shoulder Extension)  Level 2 (Red)    Shoulder  Extension Limitations  Cues for scapular retraction       Lumbar Exercises: Seated   Other Seated Lumbar Exercises  Seated B pallof press with red TB seated on aqua disk on table x 10 rpes each way       Lumbar Exercises: Supine   Bridge  3 seconds   x 12 reps      Moist Heat Therapy   Number Minutes Moist Heat  10 Minutes    Moist Heat Location  Lumbar Spine      Electrical Stimulation   Electrical Stimulation Location  R lower lumbar region    Electrical Stimulation Action  IFC    Electrical Stimulation Parameters  to tolerance,10'    Electrical Stimulation Goals  Pain             PT Education - 06/03/18 1554    Education Details  HEP update     Person(s) Educated  Patient    Methods  Explanation;Demonstration;Verbal cues  Comprehension  Returned demonstration;Verbalized understanding;Verbal cues required;Need further instruction       PT Short Term Goals - 05/19/18 1508      PT SHORT TERM GOAL #1   Title  Patient to be independent with initial HEP.    Time  3    Period  Weeks    Status  Achieved        PT Long Term Goals - 05/11/18 0940      PT LONG TERM GOAL #1   Title  Patient to be independent with advanced HEP.    Time  6    Period  Weeks    Status  On-going      PT LONG TERM GOAL #2   Title  Patient to demonstrate [facility] and pain-free lumbar AROM.    Time  6    Period  Weeks    Status  On-going      PT LONG TERM GOAL #3   Title  Patient to demonstrate >=4+/5 strength in B LEs.    Time  6    Period  Weeks    Status  On-going      PT LONG TERM GOAL #4   Title  Patient to report tolerance of 1 hour of standing without pain limiting.    Time  6    Period  Weeks    Status  On-going      PT LONG TERM GOAL #5   Title  Patient to report tolerance of climbing 3 flights of stairs to apartment without taking a break d/t pain.    Time  6    Period  Weeks    Status  On-going            Plan - 06/02/18 1415    Clinical Impression  Statement  Ellis noting good reduction in pain for two days following last visit and attributes this to, "massage" last visit and E-stim.  Tolerated progression of lumbopelvic strengthening today with addition of machine strengthening as pt. currently wishing to return to local gym.  Reviewed proper technique with Lat pulldown, machine row, and hamstring curl today and will plan to further review machine strengthening options for pt. in future visits.  Ended visit with E-stim/moist heat to lumbar spine with good relief following.      PT Treatment/Interventions  ADLs/Self Care Home Management;Cryotherapy;Electrical Stimulation;Moist Heat;Ultrasound;Gait training;Stair training;Functional mobility training;Therapeutic activities;Therapeutic exercise;Manual techniques;Patient/family education;Neuromuscular re-education;Balance training;Passive range of motion;Dry needling;Energy conservation;Splinting;Taping    Consulted and Agree with Plan of Care  Patient       Patient will benefit from skilled therapeutic intervention in order to improve the following deficits and impairments:  Decreased activity tolerance, Decreased strength, Pain, Difficulty walking, Decreased range of motion, Postural dysfunction, Impaired flexibility  Visit Diagnosis: Chronic midline low back pain with right-sided sciatica  Other symptoms and signs involving the musculoskeletal system  Muscle weakness (generalized)  Abnormal posture     Problem List There are no active problems to display for this patient.   Kermit Balo, PTA 06/03/18 3:55 PM    [facility] [address]  [address] [place], [place], [address] Phone: [phone]   Fax:  [phone]  Name: Dowell Giangrande MRN: [id number] Date of Birth: 1940/11/30

## 2018-06-04 ENCOUNTER — Ambulatory Visit: Payer: PRIVATE HEALTH INSURANCE

## 2018-06-04 DIAGNOSIS — M5441 Lumbago with sciatica, right side: Secondary | ICD-10-CM | POA: Diagnosis not present

## 2018-06-04 DIAGNOSIS — G8929 Other chronic pain: Secondary | ICD-10-CM

## 2018-06-04 DIAGNOSIS — M6281 Muscle weakness (generalized): Secondary | ICD-10-CM

## 2018-06-04 DIAGNOSIS — R293 Abnormal posture: Secondary | ICD-10-CM

## 2018-06-04 DIAGNOSIS — R29898 Other symptoms and signs involving the musculoskeletal system: Secondary | ICD-10-CM

## 2018-06-04 NOTE — Therapy (Signed)
[facility] Outpatient Rehabilitation [facility] [address]  [address] [place], [place], [address] Phone: [phone]   Fax:  [phone]  Physical Therapy Treatment  Patient Details  Name: Vansh Kubala MRN: [id number] Date of Birth: 16-Jul-1941 Referring Provider: Otila Back, MD   Encounter Date: 06/04/2018  PT End of Session - 06/04/18 0937    Visit Number  9    Number of Visits  13    Date for PT Re-Evaluation  06/17/18    Authorization Type  Commercial    PT Start Time  0930    PT Stop Time  1030    PT Time Calculation (min)  60 min    Activity Tolerance  Patient tolerated treatment well    Behavior During Therapy  [facility] for tasks assessed/performed       No past medical history on file.  No past surgical history on file.  There were no vitals filed for this visit.  Subjective Assessment - 06/04/18 0934    Subjective  pt. doing well today and feels like pain levels are improving since starting therapy.      Diagnostic tests  06/27/17 lumbar MRI: acute or subacute L1 compression fracture     Patient Stated Goals  get better    Currently in Pain?  Yes    Pain Score  4     Pain Location  Back    Pain Orientation  Right;Lower    Pain Descriptors / Indicators  Sharp    Pain Type  Chronic pain    Pain Radiating Towards  None today     Pain Onset  More than a month ago    Pain Frequency  Constant    Multiple Pain Sites  No                       [facility] Adult PT Treatment/Exercise - 06/04/18 0949      Lumbar Exercises: Stretches   Single Knee to Chest Stretch  2 reps;Left;Right;30 seconds    Single Knee to Chest Stretch Limitations  B       Lumbar Exercises: Aerobic   Nustep  L5 x      Lumbar Exercises: Machines for Strengthening   Other Lumbar Machine Exercise  BATCA low row 20# x 15 reps     Other Lumbar Machine Exercise  BATCA lat pulldown 20# x 15 rpes       Lumbar Exercises: Supine   Bridge  15 reps;3 seconds    Bridge Limitations  With isometric hip abd/ER into red TB at knees       Moist Heat Therapy   Number Minutes Moist Heat  15 Minutes    Moist Heat Location  Lumbar Spine      Electrical Stimulation   Electrical Stimulation Location  R lower lumbar region    Electrical Stimulation Action  IFC    Electrical Stimulation Parameters  to tolerance, 15'    Electrical Stimulation Goals  Pain      Manual Therapy   Manual Therapy  Soft tissue mobilization    Manual therapy comments  sidelying with bolster     Soft tissue mobilization  STM to L buttocks and L paraspinals; most tenderness in L mid lumbar paraspinals              PT Education - 06/04/18 1105    Education Details  HEP update     Person(s) Educated  Patient  Methods  Explanation;Demonstration;Verbal cues;Handout    Comprehension  Verbalized understanding;Returned demonstration;Verbal cues required;Need further instruction       PT Short Term Goals - 05/19/18 1508      PT SHORT TERM GOAL #1   Title  Patient to be independent with initial HEP.    Time  3    Period  Weeks    Status  Achieved        PT Long Term Goals - 05/11/18 0940      PT LONG TERM GOAL #1   Title  Patient to be independent with advanced HEP.    Time  6    Period  Weeks    Status  On-going      PT LONG TERM GOAL #2   Title  Patient to demonstrate [facility] and pain-free lumbar AROM.    Time  6    Period  Weeks    Status  On-going      PT LONG TERM GOAL #3   Title  Patient to demonstrate >=4+/5 strength in B LEs.    Time  6    Period  Weeks    Status  On-going      PT LONG TERM GOAL #4   Title  Patient to report tolerance of 1 hour of standing without pain limiting.    Time  6    Period  Weeks    Status  On-going      PT LONG TERM GOAL #5   Title  Patient to report tolerance of climbing 3 flights of stairs to apartment without taking a break d/t pain.    Time  6    Period  Weeks    Status  On-going            Plan -  06/04/18 1610    Clinical Impression Statement  Pt. noting ~ 10% improvement in overall pain levels since starting therapy however notes much improved functional ability to climb stairs and return to gym routine since starting therapy.  Session focused on review of machine strengthening for pt. return to gym routine and manual therapy to address ongoing tenderness/tightness in lumbar/ proximal hip musculature with good relief.  HEP updated.  Ended visit with E-stim/moist heat to lumbar spine as pt. noting good benefit from this in past visits.      PT Treatment/Interventions  ADLs/Self Care Home Management;Cryotherapy;Electrical Stimulation;Moist Heat;Ultrasound;Gait training;Stair training;Functional mobility training;Therapeutic activities;Therapeutic exercise;Manual techniques;Patient/family education;Neuromuscular re-education;Balance training;Passive range of motion;Dry needling;Energy conservation;Splinting;Taping    Consulted and Agree with Plan of Care  Patient       Patient will benefit from skilled therapeutic intervention in order to improve the following deficits and impairments:  Decreased activity tolerance, Decreased strength, Pain, Difficulty walking, Decreased range of motion, Postural dysfunction, Impaired flexibility  Visit Diagnosis: Chronic midline low back pain with right-sided sciatica  Other symptoms and signs involving the musculoskeletal system  Muscle weakness (generalized)  Abnormal posture     Problem List There are no active problems to display for this patient.   Kermit Balo, PTA 06/04/18 12:55 PM   [facility] [address]  [address] [place], [place], [address] Phone: [phone]   Fax:  [phone]  Name: Bernhardt Riemenschneider MRN: [id number] Date of Birth: November 11, 1940

## 2018-06-09 ENCOUNTER — Ambulatory Visit: Payer: PRIVATE HEALTH INSURANCE

## 2018-06-09 DIAGNOSIS — R29898 Other symptoms and signs involving the musculoskeletal system: Secondary | ICD-10-CM

## 2018-06-09 DIAGNOSIS — G8929 Other chronic pain: Secondary | ICD-10-CM

## 2018-06-09 DIAGNOSIS — M5441 Lumbago with sciatica, right side: Principal | ICD-10-CM

## 2018-06-09 DIAGNOSIS — R293 Abnormal posture: Secondary | ICD-10-CM

## 2018-06-09 DIAGNOSIS — M6281 Muscle weakness (generalized): Secondary | ICD-10-CM

## 2018-06-09 NOTE — Therapy (Addendum)
[facility] [place] [address]  [place], [place], [address] Phone: [phone]   Fax:  [phone]  Physical Therapy Treatment  Patient Details  Name: Ross Friedman MRN: [id number] Date of Birth: 30-Dec-1940 Referring Provider: Scarlette Shorts, MD   Encounter Date: 06/09/2018  PT End of Session - 06/09/18 1322    Visit Number  10    Number of Visits  13    Date for PT Re-Evaluation  06/17/18    Authorization Type  Commercial    PT Start Time  1315    PT Stop Time  1403    PT Time Calculation (min)  48 min    Activity Tolerance  Patient tolerated treatment well    Behavior During Therapy  [facility] for tasks assessed/performed       No past medical history on file.  No past surgical history on file.  There were no vitals filed for this visit.  Subjective Assessment - 06/09/18 1320    Subjective  Pt. reporting he's feeling better and stronger with stair climing.      How long can you stand comfortably?  1-2 hours    Diagnostic tests  06/27/17 lumbar MRI: acute or subacute L1 compression fracture     Patient Stated Goals  get better    Currently in Pain?  Yes    Pain Score  3     Pain Location  Back    Pain Orientation  Right;Lower    Pain Descriptors / Indicators  Sharp    Pain Type  Chronic pain    Pain Onset  More than a month ago    Pain Frequency  Constant    Aggravating Factors   unsure    Pain Relieving Factors  sitting, resting    Multiple Pain Sites  No         [facility] PT Assessment - 06/09/18 1335      Observation/Other Assessments   Focus on Therapeutic Outcomes (FOTO)   77% (23% limited)      AROM   AROM Assessment Site  Lumbar    Lumbar Flexion  distal shin   mild increase in LBP   Lumbar Extension  moderately limited    Lumbar - Right Side Bend  jt line    Lumbar - Left Side Bend  jt line    Lumbar - Right Rotation  moderately limited     Lumbar - Left Rotation  moderately limited       Strength   Strength Assessment Site  Hip;Knee;Ankle    Right/Left Hip  Right;Left    Right Hip Flexion  4+/5    Right Hip ABduction  4+/5    Right Hip ADduction  4+/5    Left Hip Flexion  4+/5    Left Hip ABduction  4+/5    Left Hip ADduction  4+/5    Right/Left Knee  Right;Left    Right Knee Flexion  4/5    Right Knee Extension  5/5    Left Knee Flexion  4+/5    Left Knee Extension  4+/5    Right/Left Ankle  Right;Left    Right Ankle Dorsiflexion  4+/5    Right Ankle Plantar Flexion  4+/5    Left Ankle Dorsiflexion  4+/5    Left Ankle Plantar Flexion  4+/5                   [facility] Adult PT Treatment/Exercise - 06/09/18 1335  Ambulation/Gait   Stairs  Yes    Stairs Assistance  6: Modified independent (Device/Increase time)    Number of Stairs  42    Height of Stairs  8    Gait Comments  Pt. able to navigate 42 steps reciprocally with light rail use with only mild increased LBP and no need for resting break; to simulate climbing stairs daily at apartment       Lumbar Exercises: Aerobic   Nustep  L5 x 63mn      Lumbar Exercises: Machines for Strengthening   Cybex Knee Flexion  B LE's: 25# x 15 rpes     Other Lumbar Machine Exercise  BATCA low row 20# x 15 reps    Working in scapular retraction    Other Lumbar Machine Exercise  BATCA lat pulldown 20# x 15 rpes       Lumbar Exercises: Standing   Heel Raises  15 reps;3 seconds    Heel Raises Limitations  B single leg calf raise     Functional Squats  10 reps;3 seconds    Functional Squats Limitations  chair - min cueing for technique                PT Short Term Goals - 05/19/18 1508      PT SHORT TERM GOAL #1   Title  Patient to be independent with initial HEP.    Time  3    Period  Weeks    Status  Achieved        PT Long Term Goals - 06/09/18 1323      PT LONG TERM GOAL #1   Title  Patient to be independent with advanced HEP.    Time  6    Period  Weeks    Status  Partially Met       PT LONG TERM GOAL #2   Title  [facility] to [facility].    Time  6    Period  Weeks    Status  On-going      PT LONG TERM GOAL #3   Title  Patient to demonstrate >=4+/5 strength in B LEs.    Time  6    Period  Weeks    Status  Partially Met      PT LONG TERM GOAL #4   Title  Patient to report tolerance of 1 hour of standing without pain limiting.    Time  6    Period  Weeks    Status  Achieved      PT LONG TERM GOAL #5   Title  Patient to report tolerance of climbing 3 flights of stairs to apartment without taking a break d/t pain.    Time  6    Period  Weeks    Status  Achieved            Plan - 06/09/18 1334    Clinical Impression Statement  Pt. noting ~ 30 % improvement in back pain since start of physical therapy however notes, "climbing my stairs area easy now" referring to three flights to get to apartment.  Pt. able to climb three flights of stairs today and able to partially meet strength goal with MMT.  Now notes he is able to standing for 1-2 hours without pain limiting him. Pt. reporting he plans to return to working out at apartment gym and reviewed appropriate lumbar stability activities again in session today with pt. tolerating well.  Pt. has now met or partially met all LTG's with exception of lumbar ROM goal and is on track to meet remaining goals in POC.      PT Treatment/Interventions  ADLs/Self Care Home Management;Cryotherapy;Electrical Stimulation;Moist Heat;Ultrasound;Gait training;Stair training;Functional mobility training;Therapeutic activities;Therapeutic exercise;Manual techniques;Patient/family education;Neuromuscular re-education;Balance training;Passive range of motion;Dry needling;Energy conservation;Splinting;Taping    Consulted and Agree with Plan of Care  Patient       Patient will benefit from skilled therapeutic intervention in order to improve the following deficits and impairments:  Decreased activity  tolerance, Decreased strength, Pain, Difficulty walking, Decreased range of motion, Postural dysfunction, Impaired flexibility  Visit Diagnosis: Chronic midline low back pain with right-sided sciatica  Other symptoms and signs involving the musculoskeletal system  Muscle weakness (generalized)  Abnormal posture     Problem List There are no active problems to display for this patient.   Bess Harvest, PTA 06/10/18 11:06 AM    [facility] [address]  [place], [place], [address] Phone: [phone]   Fax:  [phone]  Name: Ross Friedman MRN: [id number] Date of Birth: 20-Oct-1940

## 2018-06-11 ENCOUNTER — Ambulatory Visit: Payer: PRIVATE HEALTH INSURANCE

## 2018-06-11 DIAGNOSIS — R293 Abnormal posture: Secondary | ICD-10-CM

## 2018-06-11 DIAGNOSIS — M5441 Lumbago with sciatica, right side: Principal | ICD-10-CM

## 2018-06-11 DIAGNOSIS — R29898 Other symptoms and signs involving the musculoskeletal system: Secondary | ICD-10-CM

## 2018-06-11 DIAGNOSIS — G8929 Other chronic pain: Secondary | ICD-10-CM

## 2018-06-11 DIAGNOSIS — M6281 Muscle weakness (generalized): Secondary | ICD-10-CM

## 2018-06-11 NOTE — Therapy (Signed)
[facility] [address]  [place], [place], [address] Phone: [phone]   Fax:  [phone]  Physical Therapy Treatment  Patient Details  Name: Ross Friedman MRN: [id number] Date of Birth: May 20, 1941 Referring Provider: Scarlette Shorts, MD   Encounter Date: 06/11/2018  PT End of Session - 06/11/18 1410    Visit Number  11    Number of Visits  13    Date for PT Re-Evaluation  06/17/18    Authorization Type  Commercial    PT Start Time  3419    PT Stop Time  1456    PT Time Calculation (min)  53 min    Activity Tolerance  Patient tolerated treatment well    Behavior During Therapy  [facility] for tasks assessed/performed       No past medical history on file.  No past surgical history on file.  There were no vitals filed for this visit.  Subjective Assessment - 06/11/18 1759    Subjective  Pt. doing well today with no new complaints.      Diagnostic tests  06/27/17 lumbar MRI: acute or subacute L1 compression fracture     Patient Stated Goals  get better    Currently in Pain?  No/denies    Pain Score  0-No pain    Pain Location  Back    Pain Orientation  Right;Lower    Pain Descriptors / Indicators  Sharp    Pain Type  Chronic pain    Pain Onset  More than a month ago    Pain Frequency  Constant    Multiple Pain Sites  No                       [facility] Adult PT Treatment/Exercise - 06/11/18 1413      Lumbar Exercises: Aerobic   Recumbent Bike  Lvl 2, 6 min       Lumbar Exercises: Machines for Strengthening   Cybex Knee Flexion  B LE's: 30# x 10 reps       Lumbar Exercises: Standing   Heel Raises  20 reps;3 seconds    Heel Raises Limitations  B     Functional Squats  15 reps;3 seconds    Functional Squats Limitations  TRX with chair behind - min cueing for technique     Shoulder Extension  15 reps;Theraband;Strengthening    Theraband Level (Shoulder Extension)  Level 2 (Red)    Other  Standing Lumbar Exercises  Pallof press with double red TB x 10 each side   with cues for abdom. bracing      Lumbar Exercises: Seated   Hip Flexion on Ball  Strengthening;Right;Left;Limitations;10 reps      Moist Heat Therapy   Number Minutes Moist Heat  15 Minutes    Moist Heat Location  Lumbar Spine      Electrical Stimulation   Electrical Stimulation Location  R lower lumbar region    Electrical Stimulation Action  IFC    Electrical Stimulation Parameters  to tolerance, 15'    Electrical Stimulation Goals  Pain               PT Short Term Goals - 05/19/18 1508      PT SHORT TERM GOAL #1   Title  Patient to be independent with initial HEP.    Time  3    Period  Weeks    Status  Achieved  PT Long Term Goals - 06/09/18 1323      PT LONG TERM GOAL #1   Title  Patient to be independent with advanced HEP.    Time  6    Period  Weeks    Status  Partially Met      PT LONG TERM GOAL #2   Title  Patient to demonstrate [facility] and pain-free lumbar AROM.    Time  6    Period  Weeks    Status  On-going      PT LONG TERM GOAL #3   Title  Patient to demonstrate >=4+/5 strength in B LEs.    Time  6    Period  Weeks    Status  Partially Met      PT LONG TERM GOAL #4   Title  Patient to report tolerance of 1 hour of standing without pain limiting.    Time  6    Period  Weeks    Status  Achieved      PT LONG TERM GOAL #5   Title  Patient to report tolerance of climbing 3 flights of stairs to apartment without taking a break d/t pain.    Time  6    Period  Weeks    Status  Achieved            Plan - 06/11/18 1411    Clinical Impression Statement  Notes improvement in pain levels now ~ 40% improvement in LBP since starting therapy.  Dejaun has made good progress toward LTG's and on track to meet remaining goals by end of POC.  Focused on lumbopelvic stability activities today with pt. tolerating well.  Pt. now working out at apartment gym 2-3x/wk and feels  his strength is improving with therapy.      PT Treatment/Interventions  ADLs/Self Care Home Management;Cryotherapy;Electrical Stimulation;Moist Heat;Ultrasound;Gait training;Stair training;Functional mobility training;Therapeutic activities;Therapeutic exercise;Manual techniques;Patient/family education;Neuromuscular re-education;Balance training;Passive range of motion;Dry needling;Energy conservation;Splinting;Taping    Consulted and Agree with Plan of Care  Patient       Patient will benefit from skilled therapeutic intervention in order to improve the following deficits and impairments:  Decreased activity tolerance, Decreased strength, Pain, Difficulty walking, Decreased range of motion, Postural dysfunction, Impaired flexibility  Visit Diagnosis: Chronic midline low back pain with right-sided sciatica  Other symptoms and signs involving the musculoskeletal system  Muscle weakness (generalized)  Abnormal posture     Problem List There are no active problems to display for this patient.   Bess Harvest, PTA 06/11/18 6:07 PM   [facility] [address]  [place], [place], [address] Phone: [phone]   Fax:  [phone]  Name: Kenyetta Fife MRN: [id number] Date of Birth: 1941-09-10

## 2018-06-16 ENCOUNTER — Ambulatory Visit: Payer: PRIVATE HEALTH INSURANCE

## 2018-06-16 DIAGNOSIS — M5441 Lumbago with sciatica, right side: Secondary | ICD-10-CM | POA: Diagnosis not present

## 2018-06-16 DIAGNOSIS — R29898 Other symptoms and signs involving the musculoskeletal system: Secondary | ICD-10-CM

## 2018-06-16 DIAGNOSIS — R293 Abnormal posture: Secondary | ICD-10-CM

## 2018-06-16 DIAGNOSIS — G8929 Other chronic pain: Secondary | ICD-10-CM

## 2018-06-16 DIAGNOSIS — M6281 Muscle weakness (generalized): Secondary | ICD-10-CM

## 2018-06-16 NOTE — Therapy (Signed)
[facility] [address]  [place], [place], [address] Phone: [phone]   Fax:  [phone]  Physical Therapy Treatment  Patient Details  Name: Ross Friedman MRN: [id number] Date of Birth: April 10, 1941 Referring Provider: Scarlette Shorts, MD   Encounter Date: 06/16/2018  PT End of Session - 06/16/18 1321    Visit Number  12    Number of Visits  13    Date for PT Re-Evaluation  06/17/18    Authorization Type  Commercial    PT Start Time  9509    PT Stop Time  1355    PT Time Calculation (min)  38 min    Activity Tolerance  Patient tolerated treatment well    Behavior During Therapy  [facility] for tasks assessed/performed       No past medical history on file.  No past surgical history on file.  There were no vitals filed for this visit.  Subjective Assessment - 06/16/18 1319    Subjective  Pt. noting 50% improvement in overall pain levels since starting therapy.      Diagnostic tests  06/27/17 lumbar MRI: acute or subacute L1 compression fracture     Patient Stated Goals  get better    Currently in Pain?  Yes    Pain Score  6     Pain Location  Back    Pain Orientation  Right;Lower    Pain Type  Chronic pain    Pain Frequency  Intermittent                       [facility] Adult PT Treatment/Exercise - 06/16/18 1322      Self-Care   Self-Care  Other Self-Care Comments    Other Self-Care Comments   Comprehensive HEP review with addition of clam shell and side stepping with red TB issued to pt. and green TB issued to pt. for row; pt. verbalized understand      Lumbar Exercises: Aerobic   Recumbent Bike  Lvl 2, 6 min       Lumbar Exercises: Machines for Strengthening   Cybex Knee Flexion  B LE's: 30# x 15 reps       Lumbar Exercises: Standing   Other Standing Lumbar Exercises  Side stepping with red looped TB at ankles 2 x 20 ft       Lumbar Exercises: Supine   Bridge  5 seconds;15 reps    Bridge  Limitations  Hooklying       Lumbar Exercises: Sidelying   Clam  Right;Left;10 reps;3 seconds    Clam Limitations  red looped TB at knees              PT Education - 06/16/18 1353    Education Details  HEP update; red TB issued to pt. for clam shell and side stepping, green TB issued to pt. for row    Person(s) Educated  Patient    Methods  Explanation;Demonstration;Verbal cues;Handout    Comprehension  Verbalized understanding;Returned demonstration;Verbal cues required;Need further instruction       PT Short Term Goals - 05/19/18 1508      PT SHORT TERM GOAL #1   Title  Patient to be independent with initial HEP.    Time  3    Period  Weeks    Status  Achieved        PT Long Term Goals - 06/09/18 1323      PT LONG  TERM GOAL #1   Title  Patient to be independent with advanced HEP.    Time  6    Period  Weeks    Status  Partially Met      PT LONG TERM GOAL #2   Title  Patient to demonstrate [facility] and pain-free lumbar AROM.    Time  6    Period  Weeks    Status  On-going      PT LONG TERM GOAL #3   Title  Patient to demonstrate >=4+/5 strength in B LEs.    Time  6    Period  Weeks    Status  Partially Met      PT LONG TERM GOAL #4   Title  Patient to report tolerance of 1 hour of standing without pain limiting.    Time  6    Period  Weeks    Status  Achieved      PT LONG TERM GOAL #5   Title  Patient to report tolerance of climbing 3 flights of stairs to apartment without taking a break d/t pain.    Time  6    Period  Weeks    Status  Achieved            Plan - 06/16/18 1321    Clinical Impression Statement  Pt. noting ~ 50% improvement in overall pain levels since starting therapy.  Notes no issues with working out at apartment gym over this past week.  Further reviewed HEP today with update with pt. verbalizing understanding.  Pt. on track to meet remaining LTG's and anticipates transition to home program and final goal testing at upcoming  visit.      PT Treatment/Interventions  ADLs/Self Care Home Management;Cryotherapy;Electrical Stimulation;Moist Heat;Ultrasound;Gait training;Stair training;Functional mobility training;Therapeutic activities;Therapeutic exercise;Manual techniques;Patient/family education;Neuromuscular re-education;Balance training;Passive range of motion;Dry needling;Energy conservation;Splinting;Taping    Consulted and Agree with Plan of Care  Patient       Patient will benefit from skilled therapeutic intervention in order to improve the following deficits and impairments:  Decreased activity tolerance, Decreased strength, Pain, Difficulty walking, Decreased range of motion, Postural dysfunction, Impaired flexibility  Visit Diagnosis: Chronic midline low back pain with right-sided sciatica  Other symptoms and signs involving the musculoskeletal system  Muscle weakness (generalized)  Abnormal posture     Problem List There are no active problems to display for this patient.   Bess Harvest, PTA 06/16/18 4:14 PM   [facility] [address]  [place], [place], [address] Phone: [phone]   Fax:  [phone]  Name: Lamere Lightner MRN: [id number] Date of Birth: Jun 17, 1941

## 2018-06-18 ENCOUNTER — Encounter: Payer: Self-pay | Admitting: Physical Therapy

## 2018-06-18 ENCOUNTER — Ambulatory Visit: Payer: PRIVATE HEALTH INSURANCE | Admitting: Physical Therapy

## 2018-06-18 DIAGNOSIS — R29898 Other symptoms and signs involving the musculoskeletal system: Secondary | ICD-10-CM

## 2018-06-18 DIAGNOSIS — M5441 Lumbago with sciatica, right side: Secondary | ICD-10-CM | POA: Diagnosis not present

## 2018-06-18 DIAGNOSIS — R293 Abnormal posture: Secondary | ICD-10-CM

## 2018-06-18 DIAGNOSIS — M6281 Muscle weakness (generalized): Secondary | ICD-10-CM

## 2018-06-18 DIAGNOSIS — G8929 Other chronic pain: Secondary | ICD-10-CM

## 2018-06-18 NOTE — Therapy (Signed)
[facility] [address]  [place], [place], [address] Phone: [phone]   Fax:  [phone]  Physical Therapy Discharge  Patient Details  Name: Ross Friedman MRN: [id number] Date of Birth: [birth year] Referring Provider: Scarlette Shorts, MD   Encounter Date: 06/18/2018  PT End of Session - 06/18/18 1404    Visit Number  13    Number of Visits  13    Date for PT Re-Evaluation  06/17/18    Authorization Type  Commercial    PT Start Time  1315    PT Stop Time  1400    PT Time Calculation (min)  45 min    Activity Tolerance  Patient tolerated treatment well    Behavior During Therapy  [facility] for tasks assessed/performed       History reviewed. No pertinent past medical history.  History reviewed. No pertinent surgical history.  There were no vitals filed for this visit.  Subjective Assessment - 06/18/18 1317    Subjective  Patient feels that he has improved a lot since coming to therapy and would like to wrap up today. Reports 50% improvement since initial eval. Reports he feels stronger and less pain in R side.     Diagnostic tests  06/27/17 lumbar MRI: acute or subacute L1 compression fracture     Patient Stated Goals  get better    Currently in Pain?  Yes    Pain Score  3     Pain Location  Back    Pain Orientation  Right;Lower    Pain Descriptors / Indicators  Sharp    Pain Type  Chronic pain         [facility] PT Assessment - 06/18/18 0001      Observation/Other Assessments   Focus on Therapeutic Outcomes (FOTO)   72% (28% limited; 31% predicted)      AROM   AROM Assessment Site  Lumbar    Lumbar Flexion  nearly to toes   very mild R LBP   Lumbar Extension  WFL   mild R LBP   Lumbar - Right Side Bend  jt line   mild R LBP   Lumbar - Left Side Bend  jt line    Lumbar - Right Rotation  WFL   mild R LBP   Lumbar - Left Rotation  [facility]      Strength   Strength Assessment Site  Hip;Knee;Ankle    Right/Left Hip  Right;Left    Right Hip Flexion  4+/5    Right Hip ABduction  4+/5    Right Hip ADduction  4+/5    Left Hip Flexion  5/5    Left Hip ABduction  4+/5    Left Hip ADduction  4+/5    Right/Left Knee  Right;Left    Right Knee Flexion  4+/5    Right Knee Extension  5/5    Left Knee Flexion  4+/5    Left Knee Extension  5/5    Right/Left Ankle  Right;Left    Right Ankle Dorsiflexion  4+/5    Right Ankle Plantar Flexion  4+/5    Left Ankle Dorsiflexion  4+/5    Left Ankle Plantar Flexion  4+/5                   [facility] Adult PT Treatment/Exercise - 06/18/18 0001      Exercises   Exercises  Lumbar;Knee/Hip      Lumbar Exercises: Aerobic  Nustep  L3 x 6 min      Lumbar Exercises: Standing   Other Standing Lumbar Exercises  Pallof press with double red TB x 10 each side    Other Standing Lumbar Exercises  lat pulldown 10x 25#   cues for scap retraction     Lumbar Exercises: Seated   Other Seated Lumbar Exercises  cybex row narrow grip 10x 25#   cues for shoulders down and back     Knee/Hip Exercises: Standing   Functional Squat  1 set;15 reps;Limitations    Functional Squat Limitations  TRX squat with cues to contract glutes    Other Standing Knee Exercises  sidestepping with red TB around ankles 2x67f      Knee/Hip Exercises: Supine   Bridges  Strengthening;Both;1 set;10 reps;Limitations    Bridges Limitations  blue weighted ball at hips      Knee/Hip Exercises: Sidelying   Clams  10x each side with red TB             PT Education - 06/18/18 1404    Education Details  update and consolidation of HEP    Person(s) Educated  Patient    Methods  Explanation;Demonstration;Tactile cues;Verbal cues;Handout    Comprehension  Verbalized understanding;Returned demonstration       PT Short Term Goals - 06/18/18 1322      PT SHORT TERM GOAL #1   Title  Patient to be independent with initial HEP.    Time  3    Period  Weeks    Status   Achieved        PT Long Term Goals - 06/18/18 1323      PT LONG TERM GOAL #1   Title  Patient to be independent with advanced HEP.    Time  6    Period  Weeks    Status  Partially Met   reports compliance 2x/week     PT LONG TERM GOAL #2   Title  [facility]    Time  6    Period  Weeks    Status  Partially Met   good improvement in ROM, mild pain remains     PT LONG TERM GOAL #3   Title  Patient to demonstrate >=4+/5 strength in B LEs.    Time  6    Period  Weeks    Status  Achieved      PT LONG TERM GOAL #4   Title  Patient to report tolerance of 1 hour of standing without pain limiting.    Time  6    Period  Weeks    Status  Achieved      PT LONG TERM GOAL #5   Title  Patient to report tolerance of climbing 3 flights of stairs to apartment without taking a break d/t pain.    Time  6    Period  Weeks    Status  Achieved            Plan - 06/18/18 1405    Clinical Impression Statement  Patient arrived to session with report of 50% improvement since initial eval. Reports improvement in strength and pain levels on R side. Patient has met functional activity tolerance goals- able to stand for 1 hour and climb 3 flights of stairs to his apartment without issues. Patient has also met strength goal today. Still with mild pain with lumbar AROM, however good improvement in ROM noted. Reports compliance  with HEP 2x/week and has access to gym which he plans to continue attending. Worked on review and consolidation of HEP exercises and gym machines. Patient with good form and effort throughout. Intermittent cues required to correct form during clamshells. Administered HEP handout and answered all questions. Patient has met or partially met all goals and ready to D/C at this time.     PT Treatment/Interventions  ADLs/Self Care Home Management;Cryotherapy;Electrical Stimulation;Moist Heat;Ultrasound;Gait training;Stair training;Functional  mobility training;Therapeutic activities;Therapeutic exercise;Manual techniques;Patient/family education;Neuromuscular re-education;Balance training;Passive range of motion;Dry needling;Energy conservation;Splinting;Taping    PT Next Visit Plan  D/C at this time    Consulted and Agree with Plan of Care  Patient       Patient will benefit from skilled therapeutic intervention in order to improve the following deficits and impairments:  Decreased activity tolerance, Decreased strength, Pain, Difficulty walking, Decreased range of motion, Postural dysfunction, Impaired flexibility  Visit Diagnosis: Chronic midline low back pain with right-sided sciatica  Other symptoms and signs involving the musculoskeletal system  Muscle weakness (generalized)  Abnormal posture     Problem List There are no active problems to display for this patient.  PHYSICAL THERAPY DISCHARGE SUMMARY  Visits from Start of Care: 13  Current functional level related to goals / functional outcomes: See clinical impression   Remaining deficits: Mild pain with lumbar AROM, compliance with HEP   Education / Equipment: HEP  Plan: Patient agrees to discharge.  Patient goals were partially met. Patient is being discharged due to being pleased with the current functional level.  ?????     Janene Harvey, PT, DPT 06/18/18 2:12 PM   [facility] [address]  [place], [place], [address] Phone: [phone]   Fax:  [phone]  Name: Ross Friedman MRN: [id number] Date of Birth: July 23, 1941

## 2019-10-22 ENCOUNTER — Ambulatory Visit: Payer: Medicare (Managed Care) | Attending: Internal Medicine

## 2019-10-22 DIAGNOSIS — Z23 Encounter for immunization: Secondary | ICD-10-CM | POA: Insufficient documentation

## 2019-10-22 NOTE — Progress Notes (Signed)
   Covid-19 Vaccination Clinic  Name:  Miken Stecher    MRN: [id number] DOB: [birth year]  10/22/2019  Mr. Azevedo was observed post Covid-19 immunization for 15 minutes without incidence. He was provided with Vaccine Information Sheet and instruction to access the V-Safe system.   Mr. Rivero was instructed to call 911 with any severe reactions post vaccine: Marland Kitchen Difficulty breathing  . Swelling of your face and throat  . A fast heartbeat  . A bad rash all over your body  . Dizziness and weakness    Immunizations Administered    Name Date Dose VIS Date Route   Pfizer COVID-19 Vaccine 10/22/2019  1:28 PM 0.3 mL 09/10/2019 Intramuscular   Manufacturer: [employer], [employer]   Lot: [id number]   NDC: [id number]

## 2019-11-12 ENCOUNTER — Ambulatory Visit: Payer: Medicare (Managed Care) | Attending: Internal Medicine

## 2019-11-12 DIAGNOSIS — Z23 Encounter for immunization: Secondary | ICD-10-CM | POA: Insufficient documentation

## 2019-11-12 NOTE — Progress Notes (Addendum)
   Covid-19 Vaccination Clinic  Name:  Derrall Hicks    MRN: [id number] DOB: Jul 03, 1941  11/12/2019  Mr. Koren was observed post Covid-19 immunization for 15 minutes without incidence. He was provided with Vaccine Information Sheet and instruction to access the V-Safe system.   Mr. Birnie was instructed to call 911 with any severe reactions post vaccine: Marland Kitchen Difficulty breathing  . Swelling of your face and throat  . A fast heartbeat  . A bad rash all over your body  . Dizziness and weakness    Immunizations Administered    Name Date Dose VIS Date Route   Pfizer COVID-19 Vaccine 11/12/2019  8:29 AM 0.3 mL 09/10/2019 Intramuscular   Manufacturer: [employer], [employer]   Lot: [id number]   NDC: [id number]

## 2021-07-20 ENCOUNTER — Emergency Department (HOSPITAL_COMMUNITY): Payer: Medicare (Managed Care)

## 2021-07-20 ENCOUNTER — Encounter (HOSPITAL_COMMUNITY): Payer: Self-pay | Admitting: Internal Medicine

## 2021-07-20 ENCOUNTER — Observation Stay (HOSPITAL_COMMUNITY): Payer: Medicare (Managed Care)

## 2021-07-20 ENCOUNTER — Encounter: Payer: Self-pay | Admitting: Internal Medicine

## 2021-07-20 ENCOUNTER — Observation Stay (HOSPITAL_COMMUNITY)
Admission: EM | Admit: 2021-07-20 | Discharge: 2021-07-23 | Disposition: A | Discharge status: Home / Self Care | Payer: Medicare (Managed Care) | Attending: Internal Medicine | Admitting: Internal Medicine

## 2021-07-20 DIAGNOSIS — S52611A Displaced fracture of right ulna styloid process, initial encounter for closed fracture: Secondary | ICD-10-CM | POA: Insufficient documentation

## 2021-07-20 DIAGNOSIS — R55 Syncope and collapse: Secondary | ICD-10-CM | POA: Diagnosis not present

## 2021-07-20 DIAGNOSIS — Z79899 Other long term (current) drug therapy: Secondary | ICD-10-CM | POA: Diagnosis not present

## 2021-07-20 DIAGNOSIS — I700 Atherosclerosis of aorta: Secondary | ICD-10-CM | POA: Diagnosis not present

## 2021-07-20 DIAGNOSIS — K449 Diaphragmatic hernia without obstruction or gangrene: Secondary | ICD-10-CM | POA: Insufficient documentation

## 2021-07-20 DIAGNOSIS — I10 Essential (primary) hypertension: Secondary | ICD-10-CM | POA: Diagnosis present

## 2021-07-20 DIAGNOSIS — R2689 Other abnormalities of gait and mobility: Secondary | ICD-10-CM | POA: Insufficient documentation

## 2021-07-20 DIAGNOSIS — S12101A Unspecified nondisplaced fracture of second cervical vertebra, initial encounter for closed fracture: Secondary | ICD-10-CM | POA: Diagnosis not present

## 2021-07-20 DIAGNOSIS — M2540 Effusion, unspecified joint: Secondary | ICD-10-CM

## 2021-07-20 DIAGNOSIS — M25462 Effusion, left knee: Secondary | ICD-10-CM | POA: Diagnosis not present

## 2021-07-20 DIAGNOSIS — Y900 Blood alcohol level of less than 20 mg/100 ml: Secondary | ICD-10-CM | POA: Insufficient documentation

## 2021-07-20 DIAGNOSIS — Z20822 Contact with and (suspected) exposure to covid-19: Secondary | ICD-10-CM | POA: Diagnosis not present

## 2021-07-20 DIAGNOSIS — S52614A Nondisplaced fracture of right ulna styloid process, initial encounter for closed fracture: Secondary | ICD-10-CM

## 2021-07-20 HISTORY — DX: Essential (primary) hypertension: I10

## 2021-07-20 LAB — ETHANOL: Alcohol, Ethyl (B): <10 mg/dL (ref <10)

## 2021-07-20 LAB — COMPREHENSIVE METABOLIC PANEL
ALT: 21 U/L (ref 0–44)
AST: 34 U/L (ref 15–41)
Albumin: 3.7 g/dL (ref 3.5–5.0)
Alkaline Phosphatase: 46 U/L (ref 38–126)
Anion gap: 11 (ref 5–15)
BUN: 21 mg/dL (ref 8–23)
CO2: 26 mmol/L (ref 22–32)
Calcium: 9 mg/dL (ref 8.9–10.3)
Chloride: 97 mmol/L — ABNORMAL LOW (ref 98–111)
Creatinine, Ser: 1.04 mg/dL (ref 0.61–1.24)
GFR, Estimated: >60 mL/min (ref >60)
Glucose, Bld: 111 mg/dL — ABNORMAL HIGH (ref 70–99)
Potassium: 4.3 mmol/L (ref 3.5–5.1)
Sodium: 134 mmol/L — ABNORMAL LOW (ref 135–145)
Total Bilirubin: 0.9 mg/dL (ref 0.3–1.2)
Total Protein: 6.7 g/dL (ref 6.5–8.1)

## 2021-07-20 LAB — CBC WITH DIFFERENTIAL/PLATELET
Abs Immature Granulocytes: 0.07 10*3/uL (ref 0.00–0.07)
Basophils Absolute: 0 10*3/uL (ref 0.0–0.1)
Basophils Relative: 1 %
Eosinophils Absolute: 0 10*3/uL (ref 0.0–0.5)
Eosinophils Relative: 1 %
HCT: 39 % (ref 39.0–52.0)
Hemoglobin: 12.4 g/dL — ABNORMAL LOW (ref 13.0–17.0)
Immature Granulocytes: 1 %
Lymphocytes Relative: 32 %
Lymphs Abs: 1.7 10*3/uL (ref 0.7–4.0)
MCH: 29 pg (ref 26.0–34.0)
MCHC: 31.8 g/dL (ref 30.0–36.0)
MCV: 91.3 fL (ref 80.0–100.0)
Monocytes Absolute: 0.4 10*3/uL (ref 0.1–1.0)
Monocytes Relative: 7 %
Neutro Abs: 3.1 10*3/uL (ref 1.7–7.7)
Neutrophils Relative %: 58 %
Platelets: 235 10*3/uL (ref 150–400)
RBC: 4.27 MIL/uL (ref 4.22–5.81)
RDW: 13.4 % (ref 11.5–15.5)
WBC: 5.4 10*3/uL (ref 4.0–10.5)
nRBC: 0 % (ref 0.0–0.2)

## 2021-07-20 LAB — I-STAT CHEM 8, ED
BUN: 28 mg/dL — ABNORMAL HIGH (ref 8–23)
Calcium, Ion: 1.09 mmol/L — ABNORMAL LOW (ref 1.15–1.40)
Chloride: 100 mmol/L (ref 98–111)
Creatinine, Ser: 1.2 mg/dL (ref 0.61–1.24)
Glucose, Bld: 110 mg/dL — ABNORMAL HIGH (ref 70–99)
HCT: 40 % (ref 39.0–52.0)
Hemoglobin: 13.6 g/dL (ref 13.0–17.0)
Potassium: 4.5 mmol/L (ref 3.5–5.1)
Sodium: 139 mmol/L (ref 135–145)
TCO2: 34 mmol/L — ABNORMAL HIGH (ref 22–32)

## 2021-07-20 LAB — TYPE AND SCREEN
ABO/RH(D): O POS
Antibody Screen: NEGATIVE

## 2021-07-20 LAB — TSH: TSH: 1.355 u[IU]/mL (ref 0.350–4.500)

## 2021-07-20 LAB — RESP PANEL BY RT-PCR (FLU A&B, COVID) ARPGX2
Influenza A by PCR: NEGATIVE
Influenza B by PCR: NEGATIVE
SARS Coronavirus 2 by RT PCR: NEGATIVE

## 2021-07-20 LAB — ABO/RH: ABO/RH(D): O POS

## 2021-07-20 MED ORDER — SODIUM CHLORIDE 0.9% FLUSH
3.0000 mL | Freq: Two times a day (BID) | INTRAVENOUS | Status: DC
  Administered 2021-07-20 – 2021-07-23 (×6): 3 mL via INTRAVENOUS

## 2021-07-20 MED ORDER — ACETAMINOPHEN 650 MG RE SUPP: 650.0000 mg | Freq: Four times a day (QID) | RECTAL | Status: DC | PRN

## 2021-07-20 MED ORDER — IOHEXOL 350 MG/ML SOLN
80.0000 mL | Freq: Once | INTRAVENOUS | Status: AC | PRN
  Administered 2021-07-20: 80 mL via INTRAVENOUS

## 2021-07-20 MED ORDER — ONDANSETRON HCL 4 MG/2ML IJ SOLN
4.0000 mg | Freq: Four times a day (QID) | INTRAMUSCULAR | Status: DC | PRN
  Administered 2021-07-22: 4 mg via INTRAVENOUS
  Filled 2021-07-20: qty 2

## 2021-07-20 MED ORDER — FENTANYL CITRATE PF 50 MCG/ML IJ SOSY
25.0000 ug | PREFILLED_SYRINGE | Freq: Once | INTRAMUSCULAR | Status: AC
  Administered 2021-07-20: 25 ug via INTRAVENOUS
  Filled 2021-07-20: qty 1

## 2021-07-20 MED ORDER — ACETAMINOPHEN 325 MG PO TABS
650.0000 mg | ORAL_TABLET | Freq: Four times a day (QID) | ORAL | Status: DC | PRN
  Administered 2021-07-21: 650 mg via ORAL
  Filled 2021-07-20: qty 2

## 2021-07-20 MED ORDER — MORPHINE SULFATE (PF) 4 MG/ML IV SOLN
4.0000 mg | INTRAVENOUS | Status: DC | PRN
  Administered 2021-07-20 – 2021-07-21 (×5): 4 mg via INTRAVENOUS
  Filled 2021-07-20 (×6): qty 1

## 2021-07-20 MED ORDER — ONDANSETRON HCL 4 MG PO TABS: 4.0000 mg | ORAL_TABLET | Freq: Four times a day (QID) | ORAL | Status: DC | PRN
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
## 2021-07-21 ENCOUNTER — Observation Stay (HOSPITAL_BASED_OUTPATIENT_CLINIC_OR_DEPARTMENT_OTHER): Payer: Medicare (Managed Care)

## 2021-07-21 DIAGNOSIS — I10 Essential (primary) hypertension: Secondary | ICD-10-CM

## 2021-07-21 DIAGNOSIS — M2540 Effusion, unspecified joint: Secondary | ICD-10-CM

## 2021-07-21 DIAGNOSIS — R55 Syncope and collapse: Secondary | ICD-10-CM | POA: Diagnosis not present

## 2021-07-21 DIAGNOSIS — S12101A Unspecified nondisplaced fracture of second cervical vertebra, initial encounter for closed fracture: Secondary | ICD-10-CM | POA: Diagnosis not present

## 2021-07-21 LAB — ECHOCARDIOGRAM COMPLETE
AR max vel: 2.68 cm2
AV Area VTI: 2.79 cm2
AV Area mean vel: 2.67 cm2
AV Mean grad: 4.3 mmHg
AV Peak grad: 9.3 mmHg
Ao pk vel: 1.52 m/s
Area-P 1/2: 2.99 cm2
Calc EF: 61.3 %
Height: 64 in
S' Lateral: 2.3 cm
Single Plane A2C EF: 62.1 %
Single Plane A4C EF: 60.2 %
Weight: 2332.8 oz

## 2021-07-21 LAB — BASIC METABOLIC PANEL
Anion gap: 6 (ref 5–15)
BUN: 17 mg/dL (ref 8–23)
CO2: 29 mmol/L (ref 22–32)
Calcium: 8.9 mg/dL (ref 8.9–10.3)
Chloride: 103 mmol/L (ref 98–111)
Creatinine, Ser: 1.11 mg/dL (ref 0.61–1.24)
GFR, Estimated: >60 mL/min (ref >60)
Glucose, Bld: 154 mg/dL — ABNORMAL HIGH (ref 70–99)
Potassium: 4.4 mmol/L (ref 3.5–5.1)
Sodium: 138 mmol/L (ref 135–145)

## 2021-07-21 LAB — CBC
HCT: 37 % — ABNORMAL LOW (ref 39.0–52.0)
Hemoglobin: 12 g/dL — ABNORMAL LOW (ref 13.0–17.0)
MCH: 29 pg (ref 26.0–34.0)
MCHC: 32.4 g/dL (ref 30.0–36.0)
MCV: 89.4 fL (ref 80.0–100.0)
Platelets: 231 10*3/uL (ref 150–400)
RBC: 4.14 MIL/uL — ABNORMAL LOW (ref 4.22–5.81)
RDW: 13.3 % (ref 11.5–15.5)
WBC: 7.1 10*3/uL (ref 4.0–10.5)
nRBC: 0 % (ref 0.0–0.2)

## 2021-07-21 MED ORDER — HYDROCODONE-ACETAMINOPHEN 5-325 MG PO TABS
1.0000 | ORAL_TABLET | ORAL | Status: DC | PRN
  Administered 2021-07-21 – 2021-07-23 (×6): 1 via ORAL
  Filled 2021-07-21 (×6): qty 1

## 2021-07-21 MED ORDER — POLYETHYLENE GLYCOL 3350 17 G PO PACK
17.0000 g | PACK | Freq: Two times a day (BID) | ORAL | Status: DC
  Administered 2021-07-22: 17 g via ORAL
  Filled 2021-07-21: qty 1
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
## 2021-07-21 NOTE — Progress Notes (Signed)
  Echocardiogram 2D Echocardiogram has been performed.  Ross Friedman 07/21/2021, 9:23 AM
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
## 2021-07-21 NOTE — Progress Notes (Signed)
Orthopedic Tech Progress Note Patient Details:  Ross Friedman 15-Oct-1940 [id number]  Ortho Devices Type of Ortho Device: Velcro wrist splint Ortho Device/Splint Location: RUE Ortho Device/Splint Interventions: Ordered, Adjustment, Application   Post Interventions Patient Tolerated: Well Instructions Provided: Adjustment of device, Care of device  [place] A [name] 07/21/2021, 3:39 PM
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
## 2021-07-22 DIAGNOSIS — R55 Syncope and collapse: Principal | ICD-10-CM

## 2021-07-22 DIAGNOSIS — S52614A Nondisplaced fracture of right ulna styloid process, initial encounter for closed fracture: Secondary | ICD-10-CM

## 2021-07-22 LAB — GLUCOSE, CAPILLARY
Glucose-Capillary: 100 mg/dL — ABNORMAL HIGH (ref 70–99)
Glucose-Capillary: 91 mg/dL (ref 70–99)
Glucose-Capillary: 147 mg/dL — ABNORMAL HIGH (ref 70–99)

## 2021-07-22 MED ORDER — MORPHINE SULFATE (PF) 2 MG/ML IV SOLN: 2.0000 mg | INTRAVENOUS | Status: DC | PRN
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
## 2021-07-22 NOTE — Care Management Obs Status (Signed)
MEDICARE OBSERVATION STATUS NOTIFICATION   Patient Details  Name: Ross Friedman MRN: [id number] Date of Birth: Feb 28, 1941   Medicare Observation Status Notification Given:  Yes    Epifanio Lesches, RN 07/22/2021, 10:02 AM

## 2021-07-22 NOTE — Consult Note (Signed)
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  Constitutional:      Appearance: Normal appearance.  Musculoskeletal:     Right wrist: Bony tenderness present.  Neurological:     Mental Status: He is alert and oriented to person, place, and time.  Psychiatric:        Behavior: Behavior normal.   On examination of the right wrist his range of motion is entirely full.  There is no significant swelling.  There is no instability on ligamentous exam.  There is pain to palpation over the right ulnar styloid.  His hand is well-perfused.  He can flex and extend his fingers and thumb.  Assessment/Plan: Nondisplaced right wrist radial styloid fracture  This is a fracture that is treated traditionally nonoperative with just a removable wrist splint and activities as tolerated.  He can put full weight through his wrist as comfort allows and in the wrist splint.  He can remove the  brace for hygiene purposes and for comfort to gently move his wrist as comfort allows.  We can see him in follow-up in the office in a few weeks.  Kathryne Hitch 07/22/2021, 11:57 AM

## 2021-07-22 NOTE — Discharge Instructions (Addendum)
You may increase her activities using her right wrist as comfort allows. You may adjust your right wrist brace as comfort allows and remove it to work on wrist range of motion. You can come in and out of the brace for hygiene purposes and to occasionally rest your wrist out of the splint.   Post procedure wound care instructions Keep incision clean and dry for 3 days. You can remove outer dressing tomorrow. Leave steri-strips (little pieces of tape) on until seen in the office for wound check appointment. Call the office [phone]) for redness, drainage, swelling, or fever.
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
## 2021-07-23 ENCOUNTER — Encounter (HOSPITAL_COMMUNITY)
Admission: EM | Disposition: A | Discharge status: Home / Self Care | Payer: Self-pay | Source: Home / Self Care | Attending: Emergency Medicine

## 2021-07-23 ENCOUNTER — Encounter (HOSPITAL_COMMUNITY): Payer: Self-pay | Admitting: Cardiology

## 2021-07-23 DIAGNOSIS — R55 Syncope and collapse: Secondary | ICD-10-CM | POA: Diagnosis not present

## 2021-07-23 HISTORY — PX: LOOP RECORDER INSERTION: EP1214

## 2021-07-23 LAB — GLUCOSE, CAPILLARY
Glucose-Capillary: 97 mg/dL (ref 70–99)
Glucose-Capillary: 98 mg/dL (ref 70–99)

## 2021-07-23 SURGERY — LOOP RECORDER INSERTION

## 2021-07-23 MED ORDER — LIDOCAINE-EPINEPHRINE 1 %-1:100000 IJ SOLN
INTRAMUSCULAR | Status: AC
  Filled 2021-07-23: qty 1

## 2021-07-23 MED ORDER — LIDOCAINE-EPINEPHRINE 1 %-1:100000 IJ SOLN
INTRAMUSCULAR | Status: DC | PRN
  Administered 2021-07-23: 20 mL

## 2021-07-23 SURGICAL SUPPLY — 2 items
MONITOR MOBILE MNGR LINQ22 (Prosthesis & Implant Heart) ×2 IMPLANT
PACK LOOP INSERTION (CUSTOM PROCEDURE TRAY) ×2 IMPLANT
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
## 2021-07-23 NOTE — Discharge Summary (Signed)
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 Xpert Xpress SARS-CoV-2/FLU/RSV testing.  Fact Sheet for Patients: BloggerCourse.com  Fact Sheet for Healthcare Providers: SeriousBroker.it  This test is not yet approved or cleared by the [place] FDA and has been authorized for detection and/or diagnosis of SARS-CoV-2 by FDA under an Emergency Use Authorization (EUA). This EUA will remain in effect (meaning this test can be used) for the duration of the COVID-19 declaration under Section 564(b)(1) of the Act, 21 U.S.C. section 360bbb-3(b)(1), unless the authorization is terminated or revoked.  Performed at [facility], [address], [place], [place] [address]      Labs: BNP (last 3 results) No results for input(s): BNP in the last 8760 hours. Basic Metabolic Panel: Recent Labs  Lab 07/20/21 1840 07/20/21 1905 07/21/21 0251  NA 134* 139 138  K  4.3 4.5 4.4  CL 97* 100 103  CO2 26  --  29  GLUCOSE 111* 110* 154*  BUN 21 28* 17  CREATININE 1.04 1.20 1.11  CALCIUM 9.0  --  8.9   Liver Function Tests: Recent Labs  Lab 07/20/21 1840  AST 34  ALT 21  ALKPHOS 46  BILITOT 0.9  PROT 6.7  ALBUMIN 3.7   No results for input(s): LIPASE, AMYLASE in the last 168 hours. No results for input(s): AMMONIA in the last 168 hours. CBC: Recent Labs  Lab 07/20/21 1840 07/20/21 1905 07/21/21 0251  WBC 5.4  --  7.1  NEUTROABS 3.1  --   --   HGB 12.4* 13.6 12.0*  HCT 39.0 40.0 37.0*  MCV 91.3  --  89.4  PLT 235  --  231   Cardiac Enzymes: No results for input(s): CKTOTAL, CKMB, CKMBINDEX, TROPONINI in the last 168 hours. BNP: Invalid input(s): POCBNP CBG: Recent Labs  Lab 07/22/21 0649 07/22/21 0718 07/22/21 2002 07/23/21 0027 07/23/21 0450  GLUCAP 91 100* 147* 97 98   D-Dimer No results for input(s): DDIMER in the last 72 hours. Hgb A1c No results for input(s): HGBA1C in the last 72 hours. Lipid Profile No results for input(s): CHOL, HDL, LDLCALC, TRIG, CHOLHDL, LDLDIRECT in the last 72 hours. Thyroid function studies Recent Labs    07/20/21 1840  TSH 1.355   Anemia work up No results for input(s): VITAMINB12, FOLATE, FERRITIN, TIBC, IRON, RETICCTPCT in the last 72 hours. Urinalysis No results found for: COLORURINE, APPEARANCEUR, LABSPEC, PHURINE, GLUCOSEU, HGBUR, BILIRUBINUR, KETONESUR, PROTEINUR, UROBILINOGEN, NITRITE, LEUKOCYTESUR Sepsis Labs Invalid input(s): PROCALCITONIN,  WBC,  LACTICIDVEN Microbiology Recent Results (from the past 240 hour(s))  Resp Panel by RT-PCR (Flu A&B, Covid) Nasopharyngeal Swab     Status: None   Collection Time: 07/20/21  8:01 PM   Specimen: Nasopharyngeal Swab; Nasopharyngeal(NP) swabs in vial transport medium  Result Value Ref Range Status   SARS Coronavirus 2 by RT PCR NEGATIVE NEGATIVE Final    Comment: (NOTE) SARS-CoV-2 target nucleic acids are NOT DETECTED.  The  SARS-CoV-2 RNA is generally detectable in upper respiratory specimens during the acute phase of infection. The lowest concentration of SARS-CoV-2 viral copies this assay can detect is 138 copies/mL. A negative  result does not preclude SARS-Cov-2 infection and should not be used as the sole basis for treatment or other patient management decisions. A negative result may occur with  improper specimen collection/handling, submission of specimen other than nasopharyngeal swab, presence of viral mutation(s) within the areas targeted by this assay, and inadequate number of viral copies(<138 copies/mL). A negative result must be combined with clinical observations, patient history, and epidemiological information. The expected result is Negative.  Fact Sheet for Patients:  BloggerCourse.com  Fact Sheet for Healthcare Providers:  SeriousBroker.it  This test is no t yet approved or cleared by the [place] FDA and  has been authorized for detection and/or diagnosis of SARS-CoV-2 by FDA under an Emergency Use Authorization (EUA). This EUA will remain  in effect (meaning this test can be used) for the duration of the COVID-19 declaration under Section 564(b)(1) of the Act, 21 U.S.C.section 360bbb-3(b)(1), unless the authorization is terminated  or revoked sooner.       Influenza A by PCR NEGATIVE NEGATIVE Final   Influenza B by PCR NEGATIVE NEGATIVE Final    Comment: (NOTE) The Xpert Xpress SARS-CoV-2/FLU/RSV plus assay is intended as an aid in the diagnosis of influenza from Nasopharyngeal swab specimens and should not be used as a sole basis for treatment. Nasal washings and aspirates are unacceptable for Xpert Xpress SARS-CoV-2/FLU/RSV testing.  Fact Sheet for Patients: BloggerCourse.com  Fact Sheet for Healthcare Providers: SeriousBroker.it  This test is not yet approved or  cleared by the [place] FDA and has been authorized for detection and/or diagnosis of SARS-CoV-2 by FDA under an Emergency Use Authorization (EUA). This EUA will remain in effect (meaning this test can be used) for the duration of the COVID-19 declaration under Section 564(b)(1) of the Act, 21 U.S.C. section 360bbb-3(b)(1), unless the authorization is terminated or revoked.  Performed at [facility], [address] [place], [place] [address]      Patient was seen and examined on the day of discharge and was found to be in stable condition. Time coordinating discharge: 25 minutes including assessment and coordination of care, as well as examination of the patient.   SIGNED:  Noralee Stain, DO [facility] 07/23/2021, 10:23 AM

## 2021-07-23 NOTE — TOC Transition Note (Signed)
Transition of Care [facility]) - CM/SW Discharge Note   Patient Details  Name: Ross Friedman MRN: [id number] Date of Birth: 09-29-1941  Transition of Care [facility]) CM/SW Contact:  Delilah Shan, LCSWA Phone Number: 07/23/2021, 4:40 PM   Clinical Narrative:     Patient will DC to: home  Anticipated DC date: 07/23/2021  Family notified: Patient declined  Transport by: [facility] Transport   ?  Per MD patient ready for DC to home . RN, patient, notified of DC.  [facility] requested for patient.  CSW signing off.   Final next level of care: Home/Self Care Barriers to Discharge: No Barriers Identified   Patient Goals and CMS Choice Patient states their goals for this hospitalization and ongoing recovery are:: home CMS Medicare.gov Compare Post Acute Care list provided to:: Patient Choice offered to / list presented to : Patient  Discharge Placement                Patient to be transferred to facility by: [facility] transporation Name of family member notified: patient declined Patient and family notified of of transfer: 07/23/21  Discharge Plan and Services                                     Social Determinants of Health (SDOH) Interventions     Readmission Risk Interventions No flowsheet data found.

## 2021-07-23 NOTE — Progress Notes (Signed)
OT Cancellation Note  Patient Details Name: Ross Friedman MRN: [id number] DOB: 1941/03/30   Cancelled Treatment:    Reason Eval/Treat Not Completed: Patient at procedure or test/ unavailable (Cath Lab. Will return as schedule allows.)  Anabella Capshaw M Dierra Riesgo Juanitta Earnhardt MSOT, OTR/L Acute Rehab Pager: [phone] Office: [phone] 07/23/2021, 1:56 PM

## 2021-08-02 ENCOUNTER — Ambulatory Visit (INDEPENDENT_AMBULATORY_CARE_PROVIDER_SITE_OTHER): Payer: Medicare (Managed Care)

## 2021-08-02 ENCOUNTER — Other Ambulatory Visit: Payer: Self-pay

## 2021-08-02 DIAGNOSIS — R55 Syncope and collapse: Secondary | ICD-10-CM

## 2021-08-02 LAB — CUP PACEART INCLINIC DEVICE CHECK
Date Time Interrogation Session: 20221103172254
Implantable Pulse Generator Implant Date: 20221024

## 2021-08-02 NOTE — Progress Notes (Signed)
ILR wound check in clinic. Steri strips removed. Wound well healed. Home monitor transmitting nightly. No episodes. Questions answered.  

## 2021-08-03 ENCOUNTER — Telehealth: Payer: Self-pay | Admitting: Cardiology

## 2021-08-03 NOTE — Telephone Encounter (Signed)
   Pt said he cant pick up his work note today and asked if we can fax it to him. He gave fax# [phone]

## 2021-08-03 NOTE — Telephone Encounter (Signed)
Letter given to supervisor to fax

## 2021-08-06 ENCOUNTER — Telehealth: Payer: Self-pay | Admitting: Cardiology

## 2021-08-06 NOTE — Telephone Encounter (Addendum)
Left a detailed message on the CEO Mr. Edwin Dada (pts employer) confirmed voicemail to call the office back.  Left a detailed message advising Mr. Catinilla that we will require for him to fax our office with any request he is inquiring on this pts medical information and fax this on letterhead to [phone], for HIPPA compliance.   We will not release any of the pts medical information to the employer until a release is on file and any request from his employer is faxed to our office on letterhead at [phone].

## 2021-08-06 NOTE — Telephone Encounter (Signed)
patient's employer on the phone that's calling in regards to the letter that they received, from our office for the patient. they have some questions. Please advise

## 2021-08-06 NOTE — Telephone Encounter (Signed)
Patient calling to say fax wasn't received asking that the paper be refax. Please advise

## 2021-08-06 NOTE — Telephone Encounter (Signed)
Spoke with pt who states he did receive fax.  No need to re-fax letter.  Pt thanked Charity fundraiser for the call.

## 2021-08-15 NOTE — Telephone Encounter (Signed)
Left detailed message for [facility].  Advised letter received and reviewed.  Per Dr. Molli Knock ok to perform all duties listed in letter.  Reiterated only restrictions (see letter in Pt chart)  Advised to call back if any further needs.

## 2021-08-16 ENCOUNTER — Ambulatory Visit (INDEPENDENT_AMBULATORY_CARE_PROVIDER_SITE_OTHER): Payer: Medicare (Managed Care) | Admitting: Physician Assistant

## 2021-08-16 ENCOUNTER — Encounter: Payer: Self-pay | Admitting: Physician Assistant

## 2021-08-16 ENCOUNTER — Other Ambulatory Visit: Payer: Self-pay

## 2021-08-16 ENCOUNTER — Ambulatory Visit (INDEPENDENT_AMBULATORY_CARE_PROVIDER_SITE_OTHER): Payer: Medicare (Managed Care)

## 2021-08-16 DIAGNOSIS — S52614D Nondisplaced fracture of right ulna styloid process, subsequent encounter for closed fracture with routine healing: Secondary | ICD-10-CM | POA: Diagnosis not present

## 2021-08-16 MED ORDER — HYDROCODONE-ACETAMINOPHEN 5-325 MG PO TABS: 1.0000 | ORAL_TABLET | Freq: Four times a day (QID) | ORAL | 0 refills | Status: AC | PRN
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
## 2021-08-27 ENCOUNTER — Ambulatory Visit (INDEPENDENT_AMBULATORY_CARE_PROVIDER_SITE_OTHER): Payer: Medicare (Managed Care)

## 2021-08-27 DIAGNOSIS — R55 Syncope and collapse: Secondary | ICD-10-CM

## 2021-08-30 ENCOUNTER — Telehealth: Payer: Self-pay

## 2021-08-30 LAB — CUP PACEART REMOTE DEVICE CHECK
Date Time Interrogation Session: 20221128195047
Implantable Pulse Generator Implant Date: 20221024

## 2021-08-30 NOTE — Telephone Encounter (Signed)
ILR summary report received. Battery status OK. Normal device function. No new symptom, brady, or pause episodes. No new AF episodes. There was one 7 second tachycardia episode that was 207 bpm, it was previously viewed, sent to triage. Monthly summary reports and ROV/PRN.  Spoke to patient and denies any complaints. Compliant with medications. Advised we will continue to monitor at this time and call with any questions or concerns. Verbalized understanding.
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
## 2021-09-04 NOTE — Progress Notes (Signed)
Carelink Summary Report / Loop Recorder 

## 2021-09-05 ENCOUNTER — Encounter: Payer: Self-pay | Admitting: Cardiology

## 2021-09-05 ENCOUNTER — Other Ambulatory Visit: Payer: Self-pay

## 2021-09-05 ENCOUNTER — Ambulatory Visit (INDEPENDENT_AMBULATORY_CARE_PROVIDER_SITE_OTHER): Payer: Medicare (Managed Care) | Admitting: Cardiology

## 2021-09-05 VITALS — BP 130/78 | HR 70 | Ht 64.0 in | Wt 147.6 lb

## 2021-09-05 DIAGNOSIS — R55 Syncope and collapse: Secondary | ICD-10-CM

## 2021-09-05 DIAGNOSIS — I10 Essential (primary) hypertension: Secondary | ICD-10-CM

## 2021-09-05 NOTE — Patient Instructions (Addendum)
Medication Instructions:  Your physician recommends that you continue on your current medications as directed. Please refer to the Current Medication list given to you today. *If you need a refill on your cardiac medications before your next appointment, please call your pharmacy*  Lab Work: None ordered. If you have labs (blood work) drawn today and your tests are completely normal, you will receive your results only by: MyChart Message (if you have MyChart) OR A paper copy in the mail If you have any lab test that is abnormal or we need to change your treatment, we will call you to review the results.  Testing/Procedures: None ordered.  Follow-Up: At [facility], you and your health needs are our priority.  As part of our continuing mission to provide you with exceptional heart care, we have created designated Provider Care Teams.  These Care Teams include your primary Cardiologist (physician) and Advanced Practice Providers (APPs -  Physician Assistants and Nurse Practitioners) who all work together to provide you with the care you need, when you need it.  Your next appointment:   Your physician wants you to follow-up in: as needed  Remote monitoring is used to monitor your loop recorder from home.

## 2021-09-13 ENCOUNTER — Ambulatory Visit: Payer: Medicare (Managed Care) | Admitting: Physician Assistant

## 2021-09-17 ENCOUNTER — Encounter: Payer: Self-pay | Admitting: Physician Assistant

## 2021-09-17 ENCOUNTER — Other Ambulatory Visit: Payer: Self-pay

## 2021-09-17 ENCOUNTER — Ambulatory Visit (INDEPENDENT_AMBULATORY_CARE_PROVIDER_SITE_OTHER): Payer: Medicare (Managed Care) | Admitting: Physician Assistant

## 2021-09-17 DIAGNOSIS — M65341 Trigger finger, right ring finger: Secondary | ICD-10-CM | POA: Diagnosis not present

## 2021-09-17 MED ORDER — METHYLPREDNISOLONE ACETATE 40 MG/ML IJ SUSP
20.0000 mg | INTRAMUSCULAR | Status: AC | PRN
  Administered 2021-09-17: 16:00:00 20 mg

## 2021-09-17 MED ORDER — LIDOCAINE HCL 1 % IJ SOLN
0.5000 mL | INTRAMUSCULAR | Status: AC | PRN
Start: 2021-09-17 — End: 2021-09-17
  Administered 2021-09-17: 16:00:00 .5 mL
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
## 2021-09-26 ENCOUNTER — Ambulatory Visit (INDEPENDENT_AMBULATORY_CARE_PROVIDER_SITE_OTHER): Payer: Medicare (Managed Care)

## 2021-09-26 DIAGNOSIS — R55 Syncope and collapse: Secondary | ICD-10-CM | POA: Diagnosis not present

## 2021-09-30 LAB — CUP PACEART REMOTE DEVICE CHECK
Date Time Interrogation Session: 20221231194801
Implantable Pulse Generator Implant Date: 20221024

## 2021-10-08 NOTE — Progress Notes (Signed)
Carelink Summary Report / Loop Recorder 

## 2021-10-29 ENCOUNTER — Ambulatory Visit (INDEPENDENT_AMBULATORY_CARE_PROVIDER_SITE_OTHER): Payer: Medicare (Managed Care)

## 2021-10-29 DIAGNOSIS — R55 Syncope and collapse: Secondary | ICD-10-CM

## 2021-10-29 LAB — CUP PACEART REMOTE DEVICE CHECK
Date Time Interrogation Session: 20230129230854
Implantable Pulse Generator Implant Date: 20221024

## 2021-11-06 NOTE — Progress Notes (Signed)
Carelink Summary Report / Loop Recorder 

## 2021-12-03 ENCOUNTER — Ambulatory Visit (INDEPENDENT_AMBULATORY_CARE_PROVIDER_SITE_OTHER): Payer: Medicare (Managed Care)

## 2021-12-03 DIAGNOSIS — R55 Syncope and collapse: Secondary | ICD-10-CM

## 2021-12-03 LAB — CUP PACEART REMOTE DEVICE CHECK
Date Time Interrogation Session: 20230303230813
Implantable Pulse Generator Implant Date: 20221024

## 2021-12-12 NOTE — Progress Notes (Signed)
Carelink Summary Report / Loop Recorder 

## 2022-01-07 ENCOUNTER — Ambulatory Visit (INDEPENDENT_AMBULATORY_CARE_PROVIDER_SITE_OTHER): Payer: Medicare (Managed Care)

## 2022-01-07 DIAGNOSIS — R55 Syncope and collapse: Secondary | ICD-10-CM

## 2022-01-08 LAB — CUP PACEART REMOTE DEVICE CHECK
Date Time Interrogation Session: 20230405230700
Implantable Pulse Generator Implant Date: 20221024

## 2022-01-22 NOTE — Progress Notes (Signed)
Carelink Summary Report / Loop Recorder 

## 2022-02-11 ENCOUNTER — Ambulatory Visit (INDEPENDENT_AMBULATORY_CARE_PROVIDER_SITE_OTHER): Payer: Medicare (Managed Care)

## 2022-02-11 DIAGNOSIS — R55 Syncope and collapse: Secondary | ICD-10-CM | POA: Diagnosis not present

## 2022-02-11 LAB — CUP PACEART REMOTE DEVICE CHECK
Date Time Interrogation Session: 20230510230504
Implantable Pulse Generator Implant Date: 20221024

## 2022-02-28 NOTE — Progress Notes (Signed)
Carelink Summary Report / Loop Recorder 

## 2022-03-18 ENCOUNTER — Ambulatory Visit (INDEPENDENT_AMBULATORY_CARE_PROVIDER_SITE_OTHER): Payer: Medicare (Managed Care)

## 2022-03-18 DIAGNOSIS — R55 Syncope and collapse: Secondary | ICD-10-CM

## 2022-03-19 LAB — CUP PACEART REMOTE DEVICE CHECK
Date Time Interrogation Session: 20230612230442
Implantable Pulse Generator Implant Date: 20221024

## 2022-04-05 NOTE — Progress Notes (Signed)
Carelink Summary Report / Loop Recorder 

## 2022-04-21 LAB — CUP PACEART REMOTE DEVICE CHECK
Date Time Interrogation Session: 20230715230129
Implantable Pulse Generator Implant Date: 20221024

## 2022-04-22 ENCOUNTER — Ambulatory Visit (INDEPENDENT_AMBULATORY_CARE_PROVIDER_SITE_OTHER): Payer: Medicare (Managed Care)

## 2022-04-22 DIAGNOSIS — R55 Syncope and collapse: Secondary | ICD-10-CM | POA: Diagnosis not present

## 2022-05-23 NOTE — Progress Notes (Signed)
Carelink Summary Report / Loop Recorder 

## 2022-05-26 LAB — CUP PACEART REMOTE DEVICE CHECK
Date Time Interrogation Session: 20230817230437
Implantable Pulse Generator Implant Date: 20221024

## 2022-05-27 ENCOUNTER — Ambulatory Visit (INDEPENDENT_AMBULATORY_CARE_PROVIDER_SITE_OTHER): Payer: Medicare (Managed Care)

## 2022-05-27 DIAGNOSIS — R55 Syncope and collapse: Secondary | ICD-10-CM

## 2022-06-19 NOTE — Progress Notes (Signed)
Carelink Summary Report / Loop Recorder 

## 2022-07-01 ENCOUNTER — Ambulatory Visit (INDEPENDENT_AMBULATORY_CARE_PROVIDER_SITE_OTHER): Payer: Medicare (Managed Care)

## 2022-07-01 DIAGNOSIS — R55 Syncope and collapse: Secondary | ICD-10-CM | POA: Diagnosis not present

## 2022-07-02 LAB — CUP PACEART REMOTE DEVICE CHECK
Date Time Interrogation Session: 20231001230756
Implantable Pulse Generator Implant Date: 20221024

## 2022-07-15 NOTE — Progress Notes (Signed)
Carelink Summary Report / Loop Recorder 

## 2022-08-05 ENCOUNTER — Ambulatory Visit (INDEPENDENT_AMBULATORY_CARE_PROVIDER_SITE_OTHER): Payer: Medicare (Managed Care)

## 2022-08-05 DIAGNOSIS — R55 Syncope and collapse: Secondary | ICD-10-CM | POA: Diagnosis not present

## 2022-08-06 LAB — CUP PACEART REMOTE DEVICE CHECK
Date Time Interrogation Session: 20231105231144
Implantable Pulse Generator Implant Date: 20221024

## 2022-09-03 NOTE — Progress Notes (Signed)
Carelink Summary Report / Loop Recorder 

## 2022-09-09 ENCOUNTER — Ambulatory Visit (INDEPENDENT_AMBULATORY_CARE_PROVIDER_SITE_OTHER): Payer: Medicare (Managed Care)

## 2022-09-09 DIAGNOSIS — R55 Syncope and collapse: Secondary | ICD-10-CM

## 2022-09-09 LAB — CUP PACEART REMOTE DEVICE CHECK
Date Time Interrogation Session: 20231210230723
Implantable Pulse Generator Implant Date: 20221024

## 2022-10-14 ENCOUNTER — Ambulatory Visit (INDEPENDENT_AMBULATORY_CARE_PROVIDER_SITE_OTHER): Payer: Medicare (Managed Care)

## 2022-10-14 DIAGNOSIS — R55 Syncope and collapse: Secondary | ICD-10-CM | POA: Diagnosis not present

## 2022-10-14 NOTE — Progress Notes (Signed)
Carelink Summary Report / Loop Recorder

## 2022-10-15 LAB — CUP PACEART REMOTE DEVICE CHECK
Date Time Interrogation Session: 20240112230921
Implantable Pulse Generator Implant Date: 20221024

## 2022-11-17 LAB — CUP PACEART REMOTE DEVICE CHECK
Date Time Interrogation Session: 20240214230933
Implantable Pulse Generator Implant Date: 20221024

## 2022-11-18 ENCOUNTER — Ambulatory Visit: Payer: Medicare (Managed Care)

## 2022-11-18 DIAGNOSIS — R55 Syncope and collapse: Secondary | ICD-10-CM

## 2022-11-25 NOTE — Progress Notes (Signed)
Carelink Summary Report / Loop Recorder 

## 2022-12-23 ENCOUNTER — Ambulatory Visit (INDEPENDENT_AMBULATORY_CARE_PROVIDER_SITE_OTHER): Payer: Medicare (Managed Care)

## 2022-12-23 DIAGNOSIS — R55 Syncope and collapse: Secondary | ICD-10-CM | POA: Diagnosis not present

## 2022-12-23 LAB — CUP PACEART REMOTE DEVICE CHECK
Date Time Interrogation Session: 20240324232057
Implantable Pulse Generator Implant Date: 20221024

## 2022-12-30 NOTE — Progress Notes (Signed)
Carelink Summary Report / Loop Recorder 

## 2023-01-27 ENCOUNTER — Ambulatory Visit (INDEPENDENT_AMBULATORY_CARE_PROVIDER_SITE_OTHER): Payer: Medicare (Managed Care)

## 2023-01-27 DIAGNOSIS — R55 Syncope and collapse: Secondary | ICD-10-CM | POA: Diagnosis not present

## 2023-01-27 LAB — CUP PACEART REMOTE DEVICE CHECK
Date Time Interrogation Session: 20240426230216
Implantable Pulse Generator Implant Date: 20221024

## 2023-01-31 NOTE — Progress Notes (Signed)
Carelink Summary Report / Loop Recorder 

## 2023-02-26 ENCOUNTER — Ambulatory Visit (INDEPENDENT_AMBULATORY_CARE_PROVIDER_SITE_OTHER): Payer: Medicare (Managed Care)

## 2023-02-26 DIAGNOSIS — R55 Syncope and collapse: Secondary | ICD-10-CM

## 2023-02-27 LAB — CUP PACEART REMOTE DEVICE CHECK
Date Time Interrogation Session: 20240529230644
Implantable Pulse Generator Implant Date: 20221024

## 2023-02-27 NOTE — Progress Notes (Signed)
Carelink Summary Report / Loop Recorder 

## 2023-03-20 NOTE — Progress Notes (Signed)
Carelink Summary Report / Loop Recorder 

## 2023-03-31 ENCOUNTER — Ambulatory Visit: Payer: Medicare (Managed Care)

## 2023-03-31 DIAGNOSIS — R55 Syncope and collapse: Secondary | ICD-10-CM

## 2023-04-01 LAB — CUP PACEART REMOTE DEVICE CHECK
Date Time Interrogation Session: 20240701230903
Implantable Pulse Generator Implant Date: 20221024
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
## 2023-04-16 NOTE — Progress Notes (Signed)
 [facility] Summary Report / Loop Recorder 

## 2023-05-04 LAB — CUP PACEART REMOTE DEVICE CHECK
Date Time Interrogation Session: 20240803230425
Implantable Pulse Generator Implant Date: 20221024

## 2023-05-05 ENCOUNTER — Ambulatory Visit (INDEPENDENT_AMBULATORY_CARE_PROVIDER_SITE_OTHER): Payer: Medicare (Managed Care)

## 2023-05-05 DIAGNOSIS — R55 Syncope and collapse: Secondary | ICD-10-CM | POA: Diagnosis not present

## 2023-05-16 NOTE — Progress Notes (Signed)
Carelink Summary Report / Loop Recorder 

## 2023-06-06 LAB — CUP PACEART REMOTE DEVICE CHECK
Date Time Interrogation Session: 20240905230340
Implantable Pulse Generator Implant Date: 20221024

## 2023-06-09 ENCOUNTER — Ambulatory Visit (INDEPENDENT_AMBULATORY_CARE_PROVIDER_SITE_OTHER): Payer: Medicare (Managed Care)

## 2023-06-09 DIAGNOSIS — R55 Syncope and collapse: Secondary | ICD-10-CM

## 2023-06-23 NOTE — Progress Notes (Signed)
Carelink Summary Report / Loop Recorder 

## 2023-07-14 ENCOUNTER — Ambulatory Visit (INDEPENDENT_AMBULATORY_CARE_PROVIDER_SITE_OTHER): Payer: Medicare (Managed Care)

## 2023-07-14 DIAGNOSIS — R55 Syncope and collapse: Secondary | ICD-10-CM | POA: Diagnosis not present

## 2023-07-14 LAB — CUP PACEART REMOTE DEVICE CHECK
Date Time Interrogation Session: 20241013231012
Implantable Pulse Generator Implant Date: 20221024

## 2023-07-28 NOTE — Progress Notes (Signed)
Carelink Summary Report / Loop Recorder 

## 2023-08-18 ENCOUNTER — Ambulatory Visit (INDEPENDENT_AMBULATORY_CARE_PROVIDER_SITE_OTHER): Payer: Medicare (Managed Care)

## 2023-08-18 DIAGNOSIS — R55 Syncope and collapse: Secondary | ICD-10-CM | POA: Diagnosis not present

## 2023-08-18 LAB — CUP PACEART REMOTE DEVICE CHECK
Date Time Interrogation Session: 20241117231835
Implantable Pulse Generator Implant Date: 20221024

## 2023-09-10 NOTE — Progress Notes (Signed)
Carelink Summary Report / Loop Recorder 

## 2023-09-22 ENCOUNTER — Ambulatory Visit (INDEPENDENT_AMBULATORY_CARE_PROVIDER_SITE_OTHER): Payer: Medicare (Managed Care)

## 2023-09-22 DIAGNOSIS — R55 Syncope and collapse: Secondary | ICD-10-CM | POA: Diagnosis not present

## 2023-09-22 LAB — CUP PACEART REMOTE DEVICE CHECK
Date Time Interrogation Session: 20241222231034
Implantable Pulse Generator Implant Date: 20221024

## 2023-10-27 ENCOUNTER — Ambulatory Visit: Payer: Medicare (Managed Care)

## 2023-10-27 DIAGNOSIS — R55 Syncope and collapse: Secondary | ICD-10-CM

## 2023-10-27 LAB — CUP PACEART REMOTE DEVICE CHECK
Date Time Interrogation Session: 20250126231105
Implantable Pulse Generator Implant Date: 20221024

## 2023-10-29 NOTE — Progress Notes (Signed)
Carelink Summary Report / Loop Recorder

## 2023-12-01 ENCOUNTER — Ambulatory Visit (INDEPENDENT_AMBULATORY_CARE_PROVIDER_SITE_OTHER): Payer: Medicare (Managed Care)

## 2023-12-01 DIAGNOSIS — R55 Syncope and collapse: Secondary | ICD-10-CM

## 2023-12-01 LAB — CUP PACEART REMOTE DEVICE CHECK
Date Time Interrogation Session: 20250302230616
Implantable Pulse Generator Implant Date: 20221024

## 2023-12-04 NOTE — Progress Notes (Signed)
 [facility] Summary Report / Loop Recorder

## 2024-01-01 NOTE — Progress Notes (Signed)
 [facility] Summary Report / Loop Recorder

## 2024-01-05 ENCOUNTER — Ambulatory Visit: Payer: Medicare (Managed Care)

## 2024-01-05 DIAGNOSIS — R55 Syncope and collapse: Secondary | ICD-10-CM | POA: Diagnosis not present

## 2024-01-05 LAB — CUP PACEART REMOTE DEVICE CHECK
Date Time Interrogation Session: 20250406230713
Implantable Pulse Generator Implant Date: 20221024

## 2024-02-09 ENCOUNTER — Ambulatory Visit (INDEPENDENT_AMBULATORY_CARE_PROVIDER_SITE_OTHER): Payer: Medicare (Managed Care)

## 2024-02-09 DIAGNOSIS — R55 Syncope and collapse: Secondary | ICD-10-CM

## 2024-02-09 LAB — CUP PACEART REMOTE DEVICE CHECK
Date Time Interrogation Session: 20250511233605
Implantable Pulse Generator Implant Date: 20221024

## 2024-02-15 ENCOUNTER — Ambulatory Visit: Payer: Self-pay | Admitting: Cardiology

## 2024-02-16 NOTE — Progress Notes (Signed)
 [facility] Summary Report / Loop Recorder

## 2024-03-11 ENCOUNTER — Ambulatory Visit (INDEPENDENT_AMBULATORY_CARE_PROVIDER_SITE_OTHER): Payer: Medicare (Managed Care)

## 2024-03-11 DIAGNOSIS — R55 Syncope and collapse: Secondary | ICD-10-CM | POA: Diagnosis not present

## 2024-03-11 LAB — CUP PACEART REMOTE DEVICE CHECK
Date Time Interrogation Session: 20250611231323
Implantable Pulse Generator Implant Date: 20221024

## 2024-03-13 ENCOUNTER — Ambulatory Visit: Payer: Self-pay | Admitting: Cardiology

## 2024-03-24 NOTE — Progress Notes (Signed)
 [facility] Summary Report / Loop Recorder

## 2024-04-12 ENCOUNTER — Ambulatory Visit: Payer: Self-pay | Admitting: Cardiology

## 2024-04-12 ENCOUNTER — Ambulatory Visit: Payer: Medicare (Managed Care)

## 2024-04-12 DIAGNOSIS — R55 Syncope and collapse: Secondary | ICD-10-CM | POA: Diagnosis not present

## 2024-04-12 LAB — CUP PACEART REMOTE DEVICE CHECK
Date Time Interrogation Session: 20250713233033
Implantable Pulse Generator Implant Date: 20221024

## 2024-05-04 NOTE — Progress Notes (Signed)
 [facility] Summary Report / Loop Recorder

## 2024-05-13 ENCOUNTER — Ambulatory Visit (INDEPENDENT_AMBULATORY_CARE_PROVIDER_SITE_OTHER): Payer: Medicare (Managed Care)

## 2024-05-13 ENCOUNTER — Ambulatory Visit: Payer: Self-pay | Admitting: Cardiology

## 2024-05-13 DIAGNOSIS — R55 Syncope and collapse: Secondary | ICD-10-CM

## 2024-05-13 LAB — CUP PACEART REMOTE DEVICE CHECK
Date Time Interrogation Session: 20250813231924
Implantable Pulse Generator Implant Date: 20221024

## 2024-06-14 ENCOUNTER — Ambulatory Visit (INDEPENDENT_AMBULATORY_CARE_PROVIDER_SITE_OTHER): Payer: Medicare (Managed Care)

## 2024-06-14 DIAGNOSIS — R55 Syncope and collapse: Secondary | ICD-10-CM

## 2024-06-15 LAB — CUP PACEART REMOTE DEVICE CHECK
Date Time Interrogation Session: 20250914232344
Implantable Pulse Generator Implant Date: 20221024

## 2024-06-17 ENCOUNTER — Ambulatory Visit: Payer: Self-pay | Admitting: Cardiology

## 2024-06-21 NOTE — Progress Notes (Signed)
 Remote Loop Recorder Transmission

## 2024-06-24 NOTE — Progress Notes (Signed)
 Remote Loop Recorder Transmission

## 2024-07-07 NOTE — Progress Notes (Signed)
 Remote Loop Recorder Transmission

## 2024-07-14 ENCOUNTER — Ambulatory Visit (INDEPENDENT_AMBULATORY_CARE_PROVIDER_SITE_OTHER): Payer: Medicare (Managed Care)

## 2024-07-14 DIAGNOSIS — R55 Syncope and collapse: Secondary | ICD-10-CM | POA: Diagnosis not present

## 2024-07-15 ENCOUNTER — Ambulatory Visit: Payer: Self-pay | Admitting: Cardiology

## 2024-07-15 LAB — CUP PACEART REMOTE DEVICE CHECK
Date Time Interrogation Session: 20251014230934
Implantable Pulse Generator Implant Date: 20221024

## 2024-07-19 NOTE — Progress Notes (Signed)
 Remote Loop Recorder Transmission

## 2024-08-15 ENCOUNTER — Ambulatory Visit: Payer: Medicare (Managed Care)

## 2024-08-18 ENCOUNTER — Ambulatory Visit: Payer: Medicare (Managed Care) | Attending: Cardiology

## 2024-08-18 DIAGNOSIS — R55 Syncope and collapse: Secondary | ICD-10-CM | POA: Diagnosis not present

## 2024-08-18 LAB — CUP PACEART REMOTE DEVICE CHECK
Date Time Interrogation Session: 20251118232217
Implantable Pulse Generator Implant Date: 20221024

## 2024-08-19 ENCOUNTER — Ambulatory Visit: Payer: Self-pay | Admitting: Cardiology

## 2024-08-20 NOTE — Progress Notes (Signed)
 Remote Loop Recorder Transmission

## 2024-09-15 ENCOUNTER — Ambulatory Visit: Payer: Medicare (Managed Care)

## 2024-09-18 ENCOUNTER — Ambulatory Visit: Payer: Medicare (Managed Care)

## 2024-09-18 DIAGNOSIS — R55 Syncope and collapse: Secondary | ICD-10-CM

## 2024-09-19 LAB — CUP PACEART REMOTE DEVICE CHECK
Date Time Interrogation Session: 20251219231835
Implantable Pulse Generator Implant Date: 20221024

## 2024-09-20 NOTE — Progress Notes (Signed)
 Remote Loop Recorder Transmission

## 2024-09-24 ENCOUNTER — Ambulatory Visit: Payer: Self-pay | Admitting: Cardiology

## 2024-10-16 ENCOUNTER — Ambulatory Visit: Payer: Medicare (Managed Care)

## 2024-10-19 ENCOUNTER — Ambulatory Visit: Payer: Medicare (Managed Care)

## 2024-10-19 DIAGNOSIS — R55 Syncope and collapse: Secondary | ICD-10-CM

## 2024-10-20 LAB — CUP PACEART REMOTE DEVICE CHECK
Date Time Interrogation Session: 20260119231343
Implantable Pulse Generator Implant Date: 20221024

## 2024-10-21 ENCOUNTER — Ambulatory Visit: Payer: Self-pay | Admitting: Cardiology

## 2024-10-22 NOTE — Progress Notes (Signed)
 Remote Loop Recorder Transmission

## 2024-11-16 ENCOUNTER — Ambulatory Visit: Payer: Medicare (Managed Care)

## 2024-11-19 ENCOUNTER — Ambulatory Visit: Payer: Medicare (Managed Care)

## 2024-12-17 ENCOUNTER — Ambulatory Visit: Payer: Medicare (Managed Care)

## 2024-12-20 ENCOUNTER — Ambulatory Visit: Payer: Medicare (Managed Care)

## 2025-01-17 ENCOUNTER — Ambulatory Visit: Payer: Medicare (Managed Care)

## 2025-01-20 ENCOUNTER — Ambulatory Visit: Payer: Medicare (Managed Care)
